# Patient Record
Sex: Female | Born: 1935 | Race: White | Hispanic: No | Marital: Married | Smoking: Former smoker
Health system: Southern US, Community
[De-identification: ages and names within clinical notes are randomized; demographics above are authoritative.]

## PROBLEM LIST (undated history)

## (undated) DIAGNOSIS — I10 Essential (primary) hypertension: Secondary | ICD-10-CM

## (undated) DIAGNOSIS — H9190 Unspecified hearing loss, unspecified ear: Secondary | ICD-10-CM

## (undated) DIAGNOSIS — M1990 Unspecified osteoarthritis, unspecified site: Secondary | ICD-10-CM

## (undated) DIAGNOSIS — J189 Pneumonia, unspecified organism: Secondary | ICD-10-CM

## (undated) DIAGNOSIS — C801 Malignant (primary) neoplasm, unspecified: Secondary | ICD-10-CM

## (undated) DIAGNOSIS — K219 Gastro-esophageal reflux disease without esophagitis: Secondary | ICD-10-CM

## (undated) HISTORY — PX: FRACTURE SURGERY: SHX138

## (undated) HISTORY — PX: EYE SURGERY: SHX253

## (undated) HISTORY — PX: ABDOMINAL HYSTERECTOMY: SHX81

## (undated) HISTORY — PX: SPINAL FUSION: SHX223

## (undated) HISTORY — PX: TONSILLECTOMY: SUR1361

## (undated) HISTORY — PX: JOINT REPLACEMENT: SHX530

---

## 2020-02-14 ENCOUNTER — Other Ambulatory Visit: Payer: Self-pay | Admitting: Orthopaedic Surgery

## 2020-03-14 ENCOUNTER — Encounter (HOSPITAL_COMMUNITY): Payer: Self-pay

## 2020-03-14 NOTE — Progress Notes (Signed)
PCP - Windle Guard Cardiologist -   Chest x-ray -  EKG -  Stress Test -  ECHO -  Cardiac Cath -   Sleep Study -  CPAP -   Fasting Blood Sugar -  Checks Blood Sugar _____ times a day  Blood Thinner Instructions: Aspirin Instructions: Last Dose:  Anesthesia review: pt. Stopping xeljanz 1 week before , abn ekg  Patient denies shortness of breath, fever, cough and chest pain at PAT appointment  none   Patient verbalized understanding of instructions that were given to them at the PAT appointment. Patient was also instructed that they will need to review over the PAT instructions again at home before surgery.

## 2020-03-14 NOTE — Patient Instructions (Addendum)
DUE TO COVID-19 ONLY ONE VISITOR IS ALLOWED TO COME WITH YOU AND STAY IN THE WAITING ROOM ONLY DURING PRE OP AND PROCEDURE DAY OF SURGERY. TWO  VISITORS  MAY VISIT WITH YOU AFTER SURGERY IN YOUR PRIVATE ROOM DURING VISITING HOURS ONLY!   10a-8p  YOU NEED TO HAVE A COVID 19 TEST ON_  4-16-21______ @__10 :30 am _____, THIS TEST MUST BE DONE BEFORE SURGERY, COME  [address], [place] , [id number].  ([place]) ONCE YOUR COVID TEST IS COMPLETED, PLEASE BEGIN THE QUARANTINE INSTRUCTIONS AS OUTLINED IN YOUR HANDOUT.                Tishia Morquecho  03/14/2020   Your procedure is scheduled on: 03-26-20   Report to [facility] Main  Entrance   Report to admitting at     [place] AM     Call this number if you have problems the morning of surgery [phone]    Remember: NO SOLID FOOD AFTER MIDNIGHT THE NIGHT PRIOR TO SURGERY. NOTHING BY MOUTH EXCEPT CLEAR LIQUIDS UNTIL   0645 am  . PLEASE FINISH ENSURE DRINK PER SURGEON ORDER  WHICH NEEDS TO BE COMPLETED AT       0645 am then nothing by mouth .    CLEAR LIQUID DIET   Foods Allowed                                                                                 Foods Excluded  Coffee and tea, regular and decaf   No creamer                                     liquids that you cannot  Plain Jell-O any favor except red or purple                                           see through such as: Fruit ices (not with fruit pulp)                                                         milk, soups, orange juice  Iced Popsicles                                                               All solid food Carbonated beverages, regular and diet                                    Cranberry, grape and apple juices Sports drinks like Gatorade  Lightly seasoned clear broth or consume(fat free) Sugar, honey syrup  _____________________________________________________________________    BRUSH YOUR TEETH MORNING OF SURGERY AND RINSE  YOUR MOUTH OUT, NO CHEWING GUM CANDY OR MINTS.     Take these medicines the morning of surgery with A SIP OF WATER:  omeprazole, hydrocodone                                 You may not have any metal on your body including hair pins and              piercings  Do not wear jewelry, make-up, lotions, powders or perfumes, deodorant             Do not wear nail polish on your fingernails.  Do not shave  48 hours prior to surgery.               Do not bring valuables to the hospital. [place].  Contacts, dentures or bridgework may not be worn into surgery.  Leave suitcase in the car. After surgery it may be brought to your room.               Please read over the following fact sheets you were given: _____________________________________________________________________            [facility] - Preparing for Surgery Before surgery, you can play an important role.  Because skin is not sterile, your skin needs to be as free of germs as possible.  You can reduce the number of germs on your skin by washing with CHG (chlorahexidine gluconate) soap before surgery.  CHG is an antiseptic cleaner which kills germs and bonds with the skin to continue killing germs even after washing. Please DO NOT use if you have an allergy to CHG or antibacterial soaps.  If your skin becomes reddened/irritated stop using the CHG and inform your nurse when you arrive at Short Stay. Do not shave (including legs and underarms) for at least 48 hours prior to the first CHG shower.  You may shave your face/neck. Please follow these instructions carefully:  1.  Shower with CHG Soap the night before surgery and the  morning of Surgery.  2.  If you choose to wash your hair, wash your hair first as usual with your  normal  shampoo.  3.  After you shampoo, rinse your hair and body thoroughly to remove the  shampoo.                           4.  Use CHG as you would any other  liquid soap.  You can apply chg directly  to the skin and wash                       Gently with a scrungie or clean washcloth.  5.  Apply the CHG Soap to your body ONLY FROM THE NECK DOWN.   Do not use on face/ open                           Wound or open sores. Avoid contact with eyes, ears mouth and genitals (private parts).  Wash face,  Genitals (private parts) with your normal soap.             6.  Wash thoroughly, paying special attention to the area where your surgery  will be performed.  7.  Thoroughly rinse your body with warm water from the neck down.  8.  DO NOT shower/wash with your normal soap after using and rinsing off  the CHG Soap.                9.  Pat yourself dry with a clean towel.            10.  Wear clean pajamas.            11.  Place clean sheets on your bed the night of your first shower and do not  sleep with pets. Day of Surgery : Do not apply any lotions/deodorants the morning of surgery.  Please wear clean clothes to the hospital/surgery center.  FAILURE TO FOLLOW THESE INSTRUCTIONS MAY RESULT IN THE CANCELLATION OF YOUR SURGERY PATIENT SIGNATURE_________________________________  NURSE SIGNATURE__________________________________  ________________________________________________________________________   Adam Phenix  An incentive spirometer is a tool that can help keep your lungs clear and active. This tool measures how well you are filling your lungs with each breath. Taking long deep breaths may help reverse or decrease the chance of developing breathing (pulmonary) problems (especially infection) following:  A long period of time when you are unable to move or be active. BEFORE THE PROCEDURE   If the spirometer includes an indicator to show your best effort, your nurse or respiratory therapist will set it to a desired goal.  If possible, sit up straight or lean slightly forward. Try not to slouch.  Hold the incentive  spirometer in an upright position. INSTRUCTIONS FOR USE  1. Sit on the edge of your bed if possible, or sit up as far as you can in bed or on a chair. 2. Hold the incentive spirometer in an upright position. 3. Breathe out normally. 4. Place the mouthpiece in your mouth and seal your lips tightly around it. 5. Breathe in slowly and as deeply as possible, raising the piston or the ball toward the top of the column. 6. Hold your breath for 3-5 seconds or for as long as possible. Allow the piston or ball to fall to the bottom of the column. 7. Remove the mouthpiece from your mouth and breathe out normally. 8. Rest for a few seconds and repeat Steps 1 through 7 at least 10 times every 1-2 hours when you are awake. Take your time and take a few normal breaths between deep breaths. 9. The spirometer may include an indicator to show your best effort. Use the indicator as a goal to work toward during each repetition. 10. After each set of 10 deep breaths, practice coughing to be sure your lungs are clear. If you have an incision (the cut made at the time of surgery), support your incision when coughing by placing a pillow or rolled up towels firmly against it. Once you are able to get out of bed, walk around indoors and cough well. You may stop using the incentive spirometer when instructed by your caregiver.  RISKS AND COMPLICATIONS  Take your time so you do not get dizzy or light-headed.  If you are in pain, you may need to take or ask for pain medication before doing incentive spirometry. It is harder to take a deep breath if you are having pain.  AFTER USE  Rest and breathe slowly and easily.  It can be helpful to keep track of a log of your progress. Your caregiver can provide you with a simple table to help with this. If you are using the spirometer at home, follow these instructions: [place] IF:   You are having difficultly using the spirometer.  You have trouble using the  spirometer as often as instructed.  Your pain medication is not giving enough relief while using the spirometer.  You develop fever of 100.5 F (38.1 C) or higher. SEEK IMMEDIATE MEDICAL CARE IF:   You cough up bloody sputum that had not been present before.  You develop fever of 102 F (38.9 C) or greater.  You develop worsening pain at or near the incision site. MAKE SURE YOU:   Understand these instructions.  Will watch your condition.  Will get help right away if you are not doing well or get worse. Document Released: 04/05/2007 Document Revised: 02/15/2012 Document Reviewed: 06/06/2007 ExitCare Patient Information 2014 ExitCare, [place].   ________________________________________________________________________  WHAT IS A BLOOD TRANSFUSION? Blood Transfusion Information  A transfusion is the replacement of blood or some of its parts. Blood is made up of multiple cells which provide different functions.  Red blood cells carry oxygen and are used for blood loss replacement.  White blood cells fight against infection.  Platelets control bleeding.  Plasma helps clot blood.  Other blood products are available for specialized needs, such as hemophilia or other clotting disorders. BEFORE THE TRANSFUSION  Who gives blood for transfusions?   Healthy volunteers who are fully evaluated to make sure their blood is safe. This is blood bank blood. Transfusion therapy is the safest it has ever been in the practice of medicine. Before blood is taken from a donor, a complete history is taken to make sure that person has no history of diseases nor engages in risky social behavior (examples are intravenous drug use or sexual activity with multiple partners). The donor's travel history is screened to minimize risk of transmitting infections, such as malaria. The donated blood is tested for signs of infectious diseases, such as HIV and hepatitis. The blood is then tested to be sure it is  compatible with you in order to minimize the chance of a transfusion reaction. If you or a relative donates blood, this is often done in anticipation of surgery and is not appropriate for emergency situations. It takes many days to process the donated blood. RISKS AND COMPLICATIONS Although transfusion therapy is very safe and saves many lives, the main dangers of transfusion include:   Getting an infectious disease.  Developing a transfusion reaction. This is an allergic reaction to something in the blood you were given. Every precaution is taken to prevent this. The decision to have a blood transfusion has been considered carefully by your caregiver before blood is given. Blood is not given unless the benefits outweigh the risks. AFTER THE TRANSFUSION  Right after receiving a blood transfusion, you will usually feel much better and more energetic. This is especially true if your red blood cells have gotten low (anemic). The transfusion raises the level of the red blood cells which carry oxygen, and this usually causes an energy increase.  The nurse administering the transfusion will monitor you carefully for complications. HOME CARE INSTRUCTIONS  No special instructions are needed after a transfusion. You may find your energy is better. Speak with your caregiver about any limitations on activity for underlying diseases  you may have. SEEK MEDICAL CARE IF:   Your condition is not improving after your transfusion.  You develop redness or irritation at the intravenous (IV) site. SEEK IMMEDIATE MEDICAL CARE IF:  Any of the following symptoms occur over the next 12 hours:  Shaking chills.  You have a temperature by mouth above 102 F (38.9 C), not controlled by medicine.  Chest, back, or muscle pain.  People around you feel you are not acting correctly or are confused.  Shortness of breath or difficulty breathing.  Dizziness and fainting.  You get a rash or develop hives.  You have  a decrease in urine output.  Your urine turns a dark color or changes to pink, red, or brown. Any of the following symptoms occur over the next 10 days:  You have a temperature by mouth above 102 F (38.9 C), not controlled by medicine.  Shortness of breath.  Weakness after normal activity.  The white part of the eye turns yellow (jaundice).  You have a decrease in the amount of urine or are urinating less often.  Your urine turns a dark color or changes to pink, red, or brown. Document Released: 11/20/2000 Document Revised: 02/15/2012 Document Reviewed: 07/09/2008 [facility] Patient Information 2014 [place], [place].  _______________________________________________________________________

## 2020-03-15 ENCOUNTER — Encounter (HOSPITAL_COMMUNITY)
Admission: RE | Admit: 2020-03-15 | Discharge: 2020-03-15 | Disposition: A | Discharge status: Home / Self Care | Payer: Medicare Other | Source: Ambulatory Visit | Attending: Orthopaedic Surgery | Admitting: Orthopaedic Surgery

## 2020-03-15 ENCOUNTER — Ambulatory Visit (HOSPITAL_COMMUNITY)
Admission: RE | Admit: 2020-03-15 | Discharge: 2020-03-15 | Disposition: A | Discharge status: Home / Self Care | Payer: Medicare Other | Source: Ambulatory Visit | Attending: Orthopaedic Surgery | Admitting: Orthopaedic Surgery

## 2020-03-15 ENCOUNTER — Other Ambulatory Visit: Payer: Self-pay

## 2020-03-15 ENCOUNTER — Encounter (HOSPITAL_COMMUNITY): Payer: Self-pay

## 2020-03-15 DIAGNOSIS — R9431 Abnormal electrocardiogram [ECG] [EKG]: Secondary | ICD-10-CM | POA: Insufficient documentation

## 2020-03-15 DIAGNOSIS — Z01818 Encounter for other preprocedural examination: Secondary | ICD-10-CM | POA: Insufficient documentation

## 2020-03-15 HISTORY — DX: Essential (primary) hypertension: I10

## 2020-03-15 HISTORY — DX: Pneumonia, unspecified organism: J18.9

## 2020-03-15 HISTORY — DX: Unspecified osteoarthritis, unspecified site: M19.90

## 2020-03-15 HISTORY — DX: Malignant (primary) neoplasm, unspecified: C80.1

## 2020-03-15 HISTORY — DX: Gastro-esophageal reflux disease without esophagitis: K21.9

## 2020-03-15 HISTORY — DX: Unspecified hearing loss, unspecified ear: H91.90

## 2020-03-15 LAB — CBC WITH DIFFERENTIAL/PLATELET
Abs Immature Granulocytes: 0.02 10*3/uL (ref 0.00–0.07)
Basophils Absolute: 0 10*3/uL (ref 0.0–0.1)
Basophils Relative: 1 %
Eosinophils Absolute: 0.1 10*3/uL (ref 0.0–0.5)
Eosinophils Relative: 1 %
HCT: 39 % (ref 36.0–46.0)
Hemoglobin: 12.4 g/dL (ref 12.0–15.0)
Immature Granulocytes: 0 %
Lymphocytes Relative: 25 %
Lymphs Abs: 1.2 10*3/uL (ref 0.7–4.0)
MCH: 32.1 pg (ref 26.0–34.0)
MCHC: 31.8 g/dL (ref 30.0–36.0)
MCV: 101 fL — ABNORMAL HIGH (ref 80.0–100.0)
Monocytes Absolute: 0.7 10*3/uL (ref 0.1–1.0)
Monocytes Relative: 15 %
Neutro Abs: 2.9 10*3/uL (ref 1.7–7.7)
Neutrophils Relative %: 58 %
Platelets: 273 10*3/uL (ref 150–400)
RBC: 3.86 MIL/uL — ABNORMAL LOW (ref 3.87–5.11)
RDW: 13.1 % (ref 11.5–15.5)
WBC: 5 10*3/uL (ref 4.0–10.5)
nRBC: 0 % (ref 0.0–0.2)

## 2020-03-15 LAB — URINALYSIS, ROUTINE W REFLEX MICROSCOPIC
Bacteria, UA: NONE SEEN
Bilirubin Urine: NEGATIVE
Glucose, UA: NEGATIVE mg/dL
Hgb urine dipstick: NEGATIVE
Ketones, ur: NEGATIVE mg/dL
Nitrite: NEGATIVE
Protein, ur: NEGATIVE mg/dL
Specific Gravity, Urine: 1.003 — ABNORMAL LOW (ref 1.005–1.030)
pH: 7 (ref 5.0–8.0)

## 2020-03-15 LAB — BASIC METABOLIC PANEL WITH GFR
Anion gap: 8 (ref 5–15)
BUN: 16 mg/dL (ref 8–23)
CO2: 29 mmol/L (ref 22–32)
Calcium: 9.4 mg/dL (ref 8.9–10.3)
Chloride: 101 mmol/L (ref 98–111)
Creatinine, Ser: 0.49 mg/dL (ref 0.44–1.00)
GFR calc Af Amer: >60 mL/min
GFR calc non Af Amer: >60 mL/min
Glucose, Bld: 90 mg/dL (ref 70–99)
Potassium: 3.8 mmol/L (ref 3.5–5.1)
Sodium: 138 mmol/L (ref 135–145)

## 2020-03-15 LAB — SURGICAL PCR SCREEN
MRSA, PCR: POSITIVE — AB
Staphylococcus aureus: POSITIVE — AB

## 2020-03-15 LAB — TYPE AND SCREEN
ABO/RH(D): O POS
Antibody Screen: NEGATIVE

## 2020-03-15 LAB — PROTIME-INR
INR: 1 (ref 0.8–1.2)
Prothrombin Time: 13 s (ref 11.4–15.2)

## 2020-03-15 LAB — APTT: aPTT: 28 s (ref 24–36)

## 2020-03-15 LAB — ABO/RH: ABO/RH(D): O POS

## 2020-03-22 ENCOUNTER — Other Ambulatory Visit (HOSPITAL_COMMUNITY)
Admission: RE | Admit: 2020-03-22 | Discharge: 2020-03-22 | Disposition: A | Discharge status: Home / Self Care | Payer: Medicare Other | Source: Ambulatory Visit | Attending: Orthopaedic Surgery | Admitting: Orthopaedic Surgery

## 2020-03-22 DIAGNOSIS — Z01812 Encounter for preprocedural laboratory examination: Secondary | ICD-10-CM | POA: Insufficient documentation

## 2020-03-22 DIAGNOSIS — Z20822 Contact with and (suspected) exposure to covid-19: Secondary | ICD-10-CM | POA: Insufficient documentation

## 2020-03-22 LAB — SARS CORONAVIRUS 2 (TAT 6-24 HRS): SARS Coronavirus 2: NEGATIVE

## 2020-03-22 NOTE — H&P (Signed)
TOTAL HIP ADMISSION H&P  Patient is admitted for left total hip arthroplasty.  Subjective:  Chief Complaint: left hip pain  HPI: Theresa Byrd, 84 y.o. female, has a history of pain and functional disability in the left hip(s) due to arthritis and patient has failed non-surgical conservative treatments for greater than 12 weeks to include NSAID's and/or analgesics, corticosteriod injections, flexibility and strengthening excercises, use of assistive devices, weight reduction as appropriate and activity modification.  Onset of symptoms was gradual starting 5 years ago with gradually worsening course since that time.The patient noted no past surgery on the left hip(s).  Patient currently rates pain in the left hip at 10 out of 10 with activity. Patient has night pain, worsening of pain with activity and weight bearing, trendelenberg gait, pain that interfers with activities of daily living and crepitus. Patient has evidence of subchondral cysts, subchondral sclerosis, periarticular osteophytes and joint space narrowing by imaging studies. This condition presents safety issues increasing the risk of falls. There is no current active infection.  There are no problems to display for this patient.  Past Medical History:  Diagnosis Date  . Arthritis    Rheumatoid  and psoriatic  . Cancer ([place])    skin cancer  . GERD (gastroesophageal reflux disease)   . Hypertension   . Severe hearing loss   . Walking pneumonia     Past Surgical History:  Procedure Laterality Date  . ABDOMINAL HYSTERECTOMY     partial  . EYE SURGERY     cataract bil  . FRACTURE SURGERY     right ankle  . JOINT REPLACEMENT     Right hip replacement  . SPINAL FUSION     2001   . TONSILLECTOMY     and adneoids    No current facility-administered medications for this encounter.   Current Outpatient Medications  Medication Sig Dispense Refill Last Dose  . ascorbic acid ([employer] VITAMIN C) 500 MG tablet Take 500 mg by  mouth daily.     . Calcium Carb-Cholecalciferol (CALCIUM 600+D3 PO) Take 1 tablet by mouth daily.     . cholecalciferol (VITAMIN D) 25 MCG (1000 UNIT) tablet Take 1,000 Units by mouth daily.     . hydrochlorothiazide (HYDRODIURIL) 12.5 MG tablet Take 12.5 mg by mouth daily.     Marland Kitchen HYDROcodone-acetaminophen (NORCO/VICODIN) 5-325 MG tablet Take 1 tablet by mouth in the morning and at bedtime.     . Multiple Vitamin (MULTIVITAMIN WITH MINERALS) TABS tablet Take 1 tablet by mouth daily. Centrum Silver     . Multiple Vitamins-Minerals (HAIR/SKIN/NAILS/BIOTIN PO) Take 1 tablet by mouth daily.     Marland Kitchen omeprazole (PRILOSEC) 20 MG capsule Take 20 mg by mouth daily before breakfast.     . traMADol (ULTRAM) 50 MG tablet Take 50 mg by mouth daily as needed (breakthrough hip pain.).     Marland Kitchen XELJANZ 5 MG TABS Take 5 mg by mouth in the morning and at bedtime. Per pt. Dr. Stop I week prior to surgery and hold 3 weeks after surgery recommended      No Known Allergies  Social History   Tobacco Use  . Smoking status: Former Smoker    Years: 18.00  . Smokeless tobacco: Never Used  . Tobacco comment: quit 32 years ago never heavy  Substance Use Topics  . Alcohol use: Not Currently    Alcohol/week: 1.0 standard drinks    Types: 1 Glasses of wine per week    Comment: occasional  No family history on file.   Review of Systems  Musculoskeletal: Positive for arthralgias.       Left hip  All other systems reviewed and are negative.   Objective:  Physical Exam  Constitutional: She appears well-developed and well-nourished.  HENT:  Head: Normocephalic and atraumatic.  Eyes: Pupils are equal, round, and reactive to light.  Cardiovascular: Normal rate and regular rhythm.  Respiratory: Effort normal.  GI: Soft.  Musculoskeletal:     Cervical back: Normal range of motion.     Comments: Left hip is fairly painful in internal rotation today.  Her motion remains good.  I do not get any pain over the greater  trochanter.  Straight leg raise is negative.  Sensation and motor function are intact in her feet with palpable pulses on both sides.      Vital signs in last 24 hours:    Labs:   Estimated body mass index is 25.5 kg/m as calculated from the following:   Height as of 03/15/20: 5\' 6"  (1.676 m).   Weight as of 03/15/20: 71.7 kg.   Imaging Review Plain radiographs demonstrate severe degenerative joint disease of the left hip(s). The bone quality appears to be good for age and reported activity level.      Assessment/Plan:  End stage primary arthritis, left hip(s)  The patient history, physical examination, clinical judgement of the provider and imaging studies are consistent with end stage degenerative joint disease of the left hip(s) and total hip arthroplasty is deemed medically necessary. The treatment options including medical management, injection therapy, arthroscopy and arthroplasty were discussed at length. The risks and benefits of total hip arthroplasty were presented and reviewed. The risks due to aseptic loosening, infection, stiffness, dislocation/subluxation,  thromboembolic complications and other imponderables were discussed.  The patient acknowledged the explanation, agreed to proceed with the plan and consent was signed. Patient is being admitted for inpatient treatment for surgery, pain control, PT, OT, prophylactic antibiotics, VTE prophylaxis, progressive ambulation and ADL's and discharge planning.The patient is planning to be discharged home with home health services

## 2020-03-25 MED ORDER — BUPIVACAINE LIPOSOME 1.3 % IJ SUSP
10.0000 mL | Freq: Once | INTRAMUSCULAR | Status: DC
  Filled 2020-03-25: qty 10

## 2020-03-25 MED ORDER — TRANEXAMIC ACID 1000 MG/10ML IV SOLN
2000.0000 mg | INTRAVENOUS | Status: DC
  Filled 2020-03-25: qty 20

## 2020-03-26 ENCOUNTER — Observation Stay (HOSPITAL_COMMUNITY)
Admission: RE | Admit: 2020-03-26 | Discharge: 2020-03-27 | Disposition: A | Discharge status: Rehabilitation Facility | Payer: Medicare Other | Source: Other Acute Inpatient Hospital | Attending: Orthopaedic Surgery | Admitting: Orthopaedic Surgery

## 2020-03-26 ENCOUNTER — Observation Stay (HOSPITAL_COMMUNITY): Payer: Medicare Other

## 2020-03-26 ENCOUNTER — Encounter (HOSPITAL_COMMUNITY): Payer: Self-pay | Admitting: Orthopaedic Surgery

## 2020-03-26 ENCOUNTER — Encounter (HOSPITAL_COMMUNITY)
Admission: RE | Disposition: A | Discharge status: Rehabilitation Facility | Payer: Self-pay | Source: Other Acute Inpatient Hospital | Attending: Orthopaedic Surgery

## 2020-03-26 ENCOUNTER — Ambulatory Visit (HOSPITAL_COMMUNITY): Payer: Medicare Other

## 2020-03-26 ENCOUNTER — Ambulatory Visit (HOSPITAL_COMMUNITY): Payer: Medicare Other | Admitting: Physician Assistant

## 2020-03-26 ENCOUNTER — Other Ambulatory Visit: Payer: Self-pay

## 2020-03-26 ENCOUNTER — Ambulatory Visit (HOSPITAL_COMMUNITY): Payer: Medicare Other | Admitting: Certified Registered"

## 2020-03-26 DIAGNOSIS — L4050 Arthropathic psoriasis, unspecified: Secondary | ICD-10-CM | POA: Insufficient documentation

## 2020-03-26 DIAGNOSIS — Z87891 Personal history of nicotine dependence: Secondary | ICD-10-CM | POA: Insufficient documentation

## 2020-03-26 DIAGNOSIS — Z09 Encounter for follow-up examination after completed treatment for conditions other than malignant neoplasm: Secondary | ICD-10-CM

## 2020-03-26 DIAGNOSIS — I10 Essential (primary) hypertension: Secondary | ICD-10-CM | POA: Insufficient documentation

## 2020-03-26 DIAGNOSIS — M1612 Unilateral primary osteoarthritis, left hip: Principal | ICD-10-CM | POA: Diagnosis present

## 2020-03-26 DIAGNOSIS — M069 Rheumatoid arthritis, unspecified: Secondary | ICD-10-CM | POA: Insufficient documentation

## 2020-03-26 DIAGNOSIS — Z419 Encounter for procedure for purposes other than remedying health state, unspecified: Secondary | ICD-10-CM

## 2020-03-26 DIAGNOSIS — Z7982 Long term (current) use of aspirin: Secondary | ICD-10-CM | POA: Diagnosis not present

## 2020-03-26 DIAGNOSIS — Z85828 Personal history of other malignant neoplasm of skin: Secondary | ICD-10-CM | POA: Insufficient documentation

## 2020-03-26 DIAGNOSIS — K219 Gastro-esophageal reflux disease without esophagitis: Secondary | ICD-10-CM | POA: Insufficient documentation

## 2020-03-26 DIAGNOSIS — Z96641 Presence of right artificial hip joint: Secondary | ICD-10-CM | POA: Diagnosis not present

## 2020-03-26 DIAGNOSIS — Z981 Arthrodesis status: Secondary | ICD-10-CM | POA: Diagnosis not present

## 2020-03-26 DIAGNOSIS — Z90710 Acquired absence of both cervix and uterus: Secondary | ICD-10-CM | POA: Diagnosis not present

## 2020-03-26 DIAGNOSIS — Z79899 Other long term (current) drug therapy: Secondary | ICD-10-CM | POA: Insufficient documentation

## 2020-03-26 DIAGNOSIS — M5136 Other intervertebral disc degeneration, lumbar region: Secondary | ICD-10-CM | POA: Diagnosis not present

## 2020-03-26 HISTORY — PX: TOTAL HIP ARTHROPLASTY: SHX124

## 2020-03-26 SURGERY — ARTHROPLASTY, HIP, TOTAL, ANTERIOR APPROACH
Anesthesia: Spinal | Site: Hip | Laterality: Left

## 2020-03-26 MED ORDER — VANCOMYCIN HCL IN DEXTROSE 1-5 GM/200ML-% IV SOLN
1000.0000 mg | INTRAVENOUS | Status: AC
  Administered 2020-03-26: 1000 mg via INTRAVENOUS
  Filled 2020-03-26 (×2): qty 200

## 2020-03-26 MED ORDER — METHOCARBAMOL 500 MG IVPB - SIMPLE MED
500.0000 mg | Freq: Four times a day (QID) | INTRAVENOUS | Status: DC | PRN
  Administered 2020-03-26: 500 mg via INTRAVENOUS
  Filled 2020-03-26: qty 50

## 2020-03-26 MED ORDER — ALUM & MAG HYDROXIDE-SIMETH 200-200-20 MG/5ML PO SUSP: 30.0000 mL | ORAL | Status: DC | PRN

## 2020-03-26 MED ORDER — TRANEXAMIC ACID-NACL 1000-0.7 MG/100ML-% IV SOLN
1000.0000 mg | Freq: Once | INTRAVENOUS | Status: AC
  Administered 2020-03-26: 1000 mg via INTRAVENOUS
  Filled 2020-03-26: qty 100

## 2020-03-26 MED ORDER — PROPOFOL 10 MG/ML IV BOLUS
INTRAVENOUS | Status: AC
  Filled 2020-03-26: qty 20

## 2020-03-26 MED ORDER — POVIDONE-IODINE 10 % EX SWAB
2.0000 "application " | Freq: Once | CUTANEOUS | Status: AC
  Administered 2020-03-26: 2 via TOPICAL

## 2020-03-26 MED ORDER — HYDROCODONE-ACETAMINOPHEN 5-325 MG PO TABS
1.0000 | ORAL_TABLET | ORAL | Status: DC | PRN
  Administered 2020-03-26: 1 via ORAL
  Administered 2020-03-27 (×2): 2 via ORAL
  Filled 2020-03-26: qty 1
  Filled 2020-03-26 (×2): qty 2

## 2020-03-26 MED ORDER — BISACODYL 5 MG PO TBEC: 5.0000 mg | DELAYED_RELEASE_TABLET | Freq: Every day | ORAL | Status: DC | PRN

## 2020-03-26 MED ORDER — TRANEXAMIC ACID 1000 MG/10ML IV SOLN
INTRAVENOUS | Status: DC | PRN
  Administered 2020-03-26: 2000 mg via TOPICAL

## 2020-03-26 MED ORDER — CEFAZOLIN SODIUM-DEXTROSE 2-4 GM/100ML-% IV SOLN
2.0000 g | INTRAVENOUS | Status: AC
  Administered 2020-03-26: 2 g via INTRAVENOUS
  Filled 2020-03-26: qty 100

## 2020-03-26 MED ORDER — DIPHENHYDRAMINE HCL 12.5 MG/5ML PO ELIX: 12.5000 mg | ORAL_SOLUTION | ORAL | Status: DC | PRN

## 2020-03-26 MED ORDER — HYDROCHLOROTHIAZIDE 25 MG PO TABS
12.5000 mg | ORAL_TABLET | Freq: Every day | ORAL | Status: DC
  Administered 2020-03-27: 12.5 mg via ORAL
  Filled 2020-03-26: qty 1

## 2020-03-26 MED ORDER — MENTHOL 3 MG MT LOZG: 1.0000 | LOZENGE | OROMUCOSAL | Status: DC | PRN

## 2020-03-26 MED ORDER — PROPOFOL 500 MG/50ML IV EMUL
INTRAVENOUS | Status: DC | PRN
  Administered 2020-03-26: 60 ug/kg/min via INTRAVENOUS
  Administered 2020-03-26: 50 ug/kg/min via INTRAVENOUS

## 2020-03-26 MED ORDER — ACETAMINOPHEN 500 MG PO TABS
500.0000 mg | ORAL_TABLET | Freq: Four times a day (QID) | ORAL | Status: AC
  Administered 2020-03-26 – 2020-03-27 (×4): 500 mg via ORAL
  Filled 2020-03-26 (×4): qty 1

## 2020-03-26 MED ORDER — BUPIVACAINE IN DEXTROSE 0.75-8.25 % IT SOLN
INTRATHECAL | Status: DC | PRN
  Administered 2020-03-26: 1.8 mL via INTRATHECAL

## 2020-03-26 MED ORDER — DEXAMETHASONE SODIUM PHOSPHATE 10 MG/ML IJ SOLN
INTRAMUSCULAR | Status: AC
  Filled 2020-03-26: qty 1

## 2020-03-26 MED ORDER — METHOCARBAMOL 500 MG IVPB - SIMPLE MED
INTRAVENOUS | Status: AC
  Filled 2020-03-26: qty 50

## 2020-03-26 MED ORDER — MIDAZOLAM HCL 2 MG/2ML IJ SOLN
INTRAMUSCULAR | Status: DC | PRN
  Administered 2020-03-26: 1 mg via INTRAVENOUS

## 2020-03-26 MED ORDER — FENTANYL CITRATE (PF) 100 MCG/2ML IJ SOLN
INTRAMUSCULAR | Status: AC
  Filled 2020-03-26: qty 2

## 2020-03-26 MED ORDER — FENTANYL CITRATE (PF) 100 MCG/2ML IJ SOLN
INTRAMUSCULAR | Status: DC | PRN
  Administered 2020-03-26 (×2): 25 ug via INTRAVENOUS
  Administered 2020-03-26 (×2): 12.5 ug via INTRAVENOUS
  Administered 2020-03-26 (×2): 50 ug via INTRAVENOUS
  Administered 2020-03-26 (×2): 12.5 ug via INTRAVENOUS

## 2020-03-26 MED ORDER — HYDROCODONE-ACETAMINOPHEN 7.5-325 MG PO TABS: 1.0000 | ORAL_TABLET | ORAL | Status: DC | PRN

## 2020-03-26 MED ORDER — TRANEXAMIC ACID-NACL 1000-0.7 MG/100ML-% IV SOLN
1000.0000 mg | INTRAVENOUS | Status: AC
  Administered 2020-03-26: 1000 mg via INTRAVENOUS
  Filled 2020-03-26: qty 100

## 2020-03-26 MED ORDER — LIDOCAINE 2% (20 MG/ML) 5 ML SYRINGE
INTRAMUSCULAR | Status: DC | PRN
  Administered 2020-03-26: 40 mg via INTRAVENOUS

## 2020-03-26 MED ORDER — LACTATED RINGERS IV SOLN: INTRAVENOUS | Status: DC

## 2020-03-26 MED ORDER — BUPIVACAINE-EPINEPHRINE (PF) 0.5% -1:200000 IJ SOLN
INTRAMUSCULAR | Status: AC
  Filled 2020-03-26: qty 30

## 2020-03-26 MED ORDER — FENTANYL CITRATE (PF) 100 MCG/2ML IJ SOLN
25.0000 ug | INTRAMUSCULAR | Status: DC | PRN
  Administered 2020-03-26 (×2): 50 ug via INTRAVENOUS

## 2020-03-26 MED ORDER — PROPOFOL 10 MG/ML IV BOLUS
INTRAVENOUS | Status: DC | PRN
  Administered 2020-03-26: 20 mg via INTRAVENOUS
  Administered 2020-03-26: 30 mg via INTRAVENOUS
  Administered 2020-03-26: 10 mg via INTRAVENOUS

## 2020-03-26 MED ORDER — ASPIRIN 81 MG PO CHEW
81.0000 mg | CHEWABLE_TABLET | Freq: Two times a day (BID) | ORAL | Status: DC
  Filled 2020-03-26: qty 1

## 2020-03-26 MED ORDER — METOCLOPRAMIDE HCL 5 MG/ML IJ SOLN: 5.0000 mg | Freq: Three times a day (TID) | INTRAMUSCULAR | Status: DC | PRN

## 2020-03-26 MED ORDER — KETOROLAC TROMETHAMINE 15 MG/ML IJ SOLN
7.5000 mg | Freq: Four times a day (QID) | INTRAMUSCULAR | Status: AC
  Administered 2020-03-26 – 2020-03-27 (×4): 7.5 mg via INTRAVENOUS
  Filled 2020-03-26 (×4): qty 1

## 2020-03-26 MED ORDER — BUPIVACAINE-EPINEPHRINE (PF) 0.5% -1:200000 IJ SOLN
INTRAMUSCULAR | Status: DC | PRN
  Administered 2020-03-26: 30 mL via PERINEURAL

## 2020-03-26 MED ORDER — VANCOMYCIN HCL IN DEXTROSE 1-5 GM/200ML-% IV SOLN: 1000.0000 mg | Freq: Two times a day (BID) | INTRAVENOUS | Status: DC

## 2020-03-26 MED ORDER — STERILE WATER FOR IRRIGATION IR SOLN
Status: DC | PRN
  Administered 2020-03-26: 2000 mL

## 2020-03-26 MED ORDER — PHENYLEPHRINE HCL-NACL 10-0.9 MG/250ML-% IV SOLN
INTRAVENOUS | Status: DC | PRN
  Administered 2020-03-26: 20 ug/min via INTRAVENOUS

## 2020-03-26 MED ORDER — MIDAZOLAM HCL 2 MG/2ML IJ SOLN
INTRAMUSCULAR | Status: AC
  Filled 2020-03-26: qty 2

## 2020-03-26 MED ORDER — DOCUSATE SODIUM 100 MG PO CAPS
100.0000 mg | ORAL_CAPSULE | Freq: Two times a day (BID) | ORAL | Status: DC
  Administered 2020-03-26 – 2020-03-27 (×2): 100 mg via ORAL
  Filled 2020-03-26 (×2): qty 1

## 2020-03-26 MED ORDER — LIDOCAINE 2% (20 MG/ML) 5 ML SYRINGE
INTRAMUSCULAR | Status: AC
  Filled 2020-03-26: qty 5

## 2020-03-26 MED ORDER — DEXAMETHASONE SODIUM PHOSPHATE 10 MG/ML IJ SOLN
INTRAMUSCULAR | Status: DC | PRN
  Administered 2020-03-26: 4 mg via INTRAVENOUS

## 2020-03-26 MED ORDER — MORPHINE SULFATE (PF) 2 MG/ML IV SOLN: 0.5000 mg | INTRAVENOUS | Status: DC | PRN

## 2020-03-26 MED ORDER — METOCLOPRAMIDE HCL 5 MG PO TABS: 5.0000 mg | ORAL_TABLET | Freq: Three times a day (TID) | ORAL | Status: DC | PRN

## 2020-03-26 MED ORDER — ACETAMINOPHEN 500 MG PO TABS
1000.0000 mg | ORAL_TABLET | Freq: Once | ORAL | Status: AC
  Administered 2020-03-26: 1000 mg via ORAL
  Filled 2020-03-26: qty 2

## 2020-03-26 MED ORDER — BUPIVACAINE-EPINEPHRINE (PF) 0.25% -1:200000 IJ SOLN: INTRAMUSCULAR | Status: DC | PRN

## 2020-03-26 MED ORDER — CHLORHEXIDINE GLUCONATE 4 % EX LIQD: 60.0000 mL | Freq: Once | CUTANEOUS | Status: DC

## 2020-03-26 MED ORDER — BUPIVACAINE LIPOSOME 1.3 % IJ SUSP
INTRAMUSCULAR | Status: DC | PRN
  Administered 2020-03-26: 10 mL

## 2020-03-26 MED ORDER — ONDANSETRON HCL 4 MG/2ML IJ SOLN: 4.0000 mg | Freq: Four times a day (QID) | INTRAMUSCULAR | Status: DC | PRN

## 2020-03-26 MED ORDER — METHOCARBAMOL 500 MG PO TABS: 500.0000 mg | ORAL_TABLET | Freq: Four times a day (QID) | ORAL | Status: DC | PRN

## 2020-03-26 MED ORDER — 0.9 % SODIUM CHLORIDE (POUR BTL) OPTIME
TOPICAL | Status: DC | PRN
  Administered 2020-03-26: 1000 mL

## 2020-03-26 MED ORDER — PHENOL 1.4 % MT LIQD: 1.0000 | OROMUCOSAL | Status: DC | PRN

## 2020-03-26 MED ORDER — ONDANSETRON HCL 4 MG PO TABS: 4.0000 mg | ORAL_TABLET | Freq: Four times a day (QID) | ORAL | Status: DC | PRN

## 2020-03-26 MED ORDER — PHENYLEPHRINE HCL (PRESSORS) 10 MG/ML IV SOLN
INTRAVENOUS | Status: AC
  Filled 2020-03-26: qty 1

## 2020-03-26 MED ORDER — EPHEDRINE SULFATE-NACL 50-0.9 MG/10ML-% IV SOSY
PREFILLED_SYRINGE | INTRAVENOUS | Status: DC | PRN
  Administered 2020-03-26: 5 mg via INTRAVENOUS

## 2020-03-26 MED ORDER — PROPOFOL 1000 MG/100ML IV EMUL
INTRAVENOUS | Status: AC
  Filled 2020-03-26: qty 100

## 2020-03-26 MED ORDER — ACETAMINOPHEN 325 MG PO TABS: 325.0000 mg | ORAL_TABLET | Freq: Four times a day (QID) | ORAL | Status: DC | PRN

## 2020-03-26 MED ORDER — ONDANSETRON HCL 4 MG/2ML IJ SOLN
INTRAMUSCULAR | Status: DC | PRN
  Administered 2020-03-26: 4 mg via INTRAVENOUS

## 2020-03-26 MED ORDER — PANTOPRAZOLE SODIUM 40 MG PO TBEC
40.0000 mg | DELAYED_RELEASE_TABLET | Freq: Every day | ORAL | Status: DC
  Administered 2020-03-27: 40 mg via ORAL
  Filled 2020-03-26: qty 1

## 2020-03-26 MED ORDER — ONDANSETRON HCL 4 MG/2ML IJ SOLN
INTRAMUSCULAR | Status: AC
  Filled 2020-03-26: qty 2

## 2020-03-26 SURGICAL SUPPLY — 43 items
BAG DECANTER FOR FLEXI CONT (MISCELLANEOUS) ×3 IMPLANT
BLADE SAW SGTL 18X1.27X75 (BLADE) ×2 IMPLANT
BLADE SAW SGTL 18X1.27X75MM (BLADE) ×1
BOOTIES KNEE HIGH SLOAN (MISCELLANEOUS) ×3 IMPLANT
CELLS DAT CNTRL 66122 CELL SVR (MISCELLANEOUS) ×1 IMPLANT
COVER PERINEAL POST (MISCELLANEOUS) ×3 IMPLANT
COVER SURGICAL LIGHT HANDLE (MISCELLANEOUS) ×3 IMPLANT
COVER WAND RF STERILE (DRAPES) IMPLANT
CUP GRIPTON 48MM 100 HIP (Hips) ×3 IMPLANT
DECANTER SPIKE VIAL GLASS SM (MISCELLANEOUS) ×3 IMPLANT
DRAPE IMP U-DRAPE 54X76 (DRAPES) ×3 IMPLANT
DRAPE STERI IOBAN 125X83 (DRAPES) ×3 IMPLANT
DRAPE U-SHAPE 47X51 STRL (DRAPES) ×6 IMPLANT
DRSG AQUACEL AG ADV 3.5X 6 (GAUZE/BANDAGES/DRESSINGS) ×3 IMPLANT
DURAPREP 26ML APPLICATOR (WOUND CARE) ×3 IMPLANT
ELECT BLADE TIP CTD 4 INCH (ELECTRODE) ×3 IMPLANT
ELECT REM PT RETURN 15FT ADLT (MISCELLANEOUS) ×3 IMPLANT
ELIMINATOR HOLE APEX DEPUY (Hips) ×3 IMPLANT
GLOVE BIO SURGEON STRL SZ8 (GLOVE) ×6 IMPLANT
GLOVE BIOGEL PI IND STRL 8 (GLOVE) ×2 IMPLANT
GLOVE BIOGEL PI INDICATOR 8 (GLOVE) ×4
GOWN STRL REUS W/TWL XL LVL3 (GOWN DISPOSABLE) ×6 IMPLANT
HEAD FEM STD 32X+9 STRL (Hips) ×3 IMPLANT
HOLDER FOLEY CATH W/STRAP (MISCELLANEOUS) ×3 IMPLANT
KIT TURNOVER KIT A (KITS) IMPLANT
LINER ACET 32X48 (Liner) ×3 IMPLANT
MANIFOLD NEPTUNE II (INSTRUMENTS) ×3 IMPLANT
NEEDLE HYPO 22GX1.5 SAFETY (NEEDLE) ×3 IMPLANT
NS IRRIG 1000ML POUR BTL (IV SOLUTION) ×3 IMPLANT
PACK ANTERIOR HIP CUSTOM (KITS) ×3 IMPLANT
PENCIL SMOKE EVACUATOR (MISCELLANEOUS) IMPLANT
PROTECTOR NERVE ULNAR (MISCELLANEOUS) ×3 IMPLANT
RTRCTR WOUND ALEXIS 18CM MED (MISCELLANEOUS) ×3
STEM FEMORAL SZ5 HIGH ACTIS (Stem) ×3 IMPLANT
SUT ETHIBOND NAB CT1 #1 30IN (SUTURE) ×6 IMPLANT
SUT VIC AB 1 CT1 36 (SUTURE) ×3 IMPLANT
SUT VIC AB 2-0 CT1 27 (SUTURE) ×2
SUT VIC AB 2-0 CT1 TAPERPNT 27 (SUTURE) ×1 IMPLANT
SUT VICRYL AB 3-0 FS1 BRD 27IN (SUTURE) ×3 IMPLANT
SUT VLOC 180 0 24IN GS25 (SUTURE) ×3 IMPLANT
SYR 50ML LL SCALE MARK (SYRINGE) ×3 IMPLANT
TRAY FOLEY MTR SLVR 16FR STAT (SET/KITS/TRAYS/PACK) ×3 IMPLANT
YANKAUER SUCT BULB TIP 10FT TU (MISCELLANEOUS) ×3 IMPLANT

## 2020-03-26 NOTE — Anesthesia Procedure Notes (Signed)
Spinal  Patient location during procedure: OR Start time: 03/26/2020 12:50 PM End time: 03/26/2020 1:00 PM Staffing Performed: anesthesiologist  Anesthesiologist: Freddrick March, MD Preanesthetic Checklist Completed: patient identified, IV checked, risks and benefits discussed, surgical consent, monitors and equipment checked, pre-op evaluation and timeout performed Spinal Block Patient position: sitting Prep: DuraPrep and site prepped and draped Patient monitoring: cardiac monitor, continuous pulse ox and blood pressure Approach: right paramedian Location: L3-4 Injection technique: single-shot Needle Needle type: Pencan  Needle gauge: 24 G Needle length: 9 cm Assessment Sensory level: T6 Additional Notes Functioning IV was confirmed and monitors were applied. Sterile prep and drape, including hand hygiene and sterile gloves were used. The patient was positioned and the spine was prepped. The skin was anesthetized with lidocaine.  Free flow of clear CSF was obtained prior to injecting local anesthetic into the CSF.  The spinal needle aspirated freely following injection.  The needle was carefully withdrawn.  The patient tolerated the procedure well.

## 2020-03-26 NOTE — Op Note (Signed)
PRE-OP DIAGNOSIS:  LEFT HIP DEGENERATIVE JOINT DISEASE POST-OP DIAGNOSIS: same PROCEDURE:  LEFT TOTAL HIP ARTHROPLASTY ANTERIOR APPROACH ANESTHESIA:  Spinal and MAC SURGEON:  Melrose Nakayama MD ASSISTANT:  Loni Dolly PA-C   INDICATIONS FOR PROCEDURE:  The patient is a 84 y.o. female with a long history of a painful hip.  This has persisted despite multiple conservative measures.  The patient has persisted with pain and dysfunction making rest and activity difficult.  A total hip replacement is offered as surgical treatment.  Informed operative consent was obtained after discussion of possible complications including reaction to anesthesia, infection, neurovascular injury, dislocation, DVT, PE, and death.  The importance of the postoperative rehab program to optimize result was stressed with the patient.  SUMMARY OF FINDINGS AND PROCEDURE:  Under the above anesthesia through a anterior approach an the Hana table a left THR was performed.  The patient had severe degenerative change and fair bone quality.  We used DePuy components to replace the hip and these were size 5 high offset Actis femur capped with a +9 38m metal hip ball.  On the acetabular side we used a size 48 Gription shell with a plus 0 neutral polyethylene liner.  We did use a hole eliminator.  ALoni DollyPA-C assisted throughout and was invaluable to the completion of the case in that he helped position and retract while I performed the procedure.  He also closed simultaneously to help minimize OR time.  I used fluoroscopy throughout the case to check position of components and leg lengths and read all these views myself.  DESCRIPTION OF PROCEDURE:  The patient was taken to the OR suite where the above anesthetic was applied.  The patient was then positioned on the Hana table supine.  All bony prominences were appropriately padded.  Prep and drape was then performed in normal sterile fashion.  The patient was given kefzol and vancomycin  preoperative antibiotic and an appropriate time out was performed.  We then took an anterior approach to the left hip.  Dissection was taken through adipose to the tensor fascia lata fascia.  This structure was incised longitudinally and we dissected in the intermuscular interval just medial to this muscle.  Cobra retractors were placed superior and inferior to the femoral neck superficial to the capsule.  A capsular incision was then made and the retractors were placed along the femoral neck.  Xray was brought in to get a good level for the femoral neck cut which was made with an oscillating saw and osteotome.  The femoral head was removed with a corkscrew.  The acetabulum was exposed and some labral tissues were excised. Reaming was taken to the inside wall of the pelvis and sequentially up to 1 mm smaller than the actual component.  A trial of components was done and then the aforementioned acetabular shell was placed in appropriate tilt and anteversion confirmed by fluoroscopy. The liner was placed along with the hole eliminator and attention was turned to the femur.  The leg was brought down and over into adduction and the elevator bar was used to raise the femur up gently in the wound.  The piriformis was released with care taken to preserve the obturator internus attachment and all of the posterior capsule. The femur was reamed and then broached to the appropriate size.  A trial reduction was done and the aforementioned head and neck assembly gave uKoreathe best stability in extension with external rotation.  Leg lengths were felt to be  about equal by fluoroscopic exam.  The trial components were removed and the wound irrigated.  We then placed the femoral component in appropriate anteversion.  The head was applied to a dry stem neck and the hip again reduced.  It was again stable in the aforementioned position.  The would was irrigated again followed by re-approximation of anterior capsule with ethibond suture.  Tensor fascia was repaired with V-loc suture  followed by deep closure with #O and #2 undyed vicryl.  Skin was closed with subQ stitch and steristrips followed by a sterile dressing.  EBL and IOF can be obtained from anesthesia records.  DISPOSITION:  The patient was extubated in the OR and taken to PACU in stable condition to be admitted to the Orthopedic Surgery for appropriate post-op care to include perioperative antibiotics and DVT prophylaxis.

## 2020-03-26 NOTE — Transfer of Care (Signed)
Immediate Anesthesia Transfer of Care Note  Patient: Theresa Byrd  Procedure(s) Performed: LEFT TOTAL HIP ARTHROPLASTY ANTERIOR APPROACH (Left Hip)  Patient Location: PACU  Anesthesia Type:Spinal  Level of Consciousness: awake, alert  and oriented  Airway & Oxygen Therapy: Patient Spontanous Breathing and Patient connected to face mask oxygen  Post-op Assessment: Report given to RN and Post -op Vital signs reviewed and stable  Post vital signs: Reviewed and stable  Last Vitals:  Vitals Value Taken Time  BP 130/103 03/26/20 1454  Temp 36.3 C 03/26/20 1454  Pulse 68 03/26/20 1459  Resp 16 03/26/20 1459  SpO2 100 % 03/26/20 1459  Vitals shown include unvalidated device data.  Last Pain:  Vitals:   03/26/20 1454  TempSrc:   PainSc: 6       Patients Stated Pain Goal: 2 ([id number])  Complications: No apparent anesthesia complications

## 2020-03-26 NOTE — Plan of Care (Signed)
Plan of care 

## 2020-03-26 NOTE — Interval H&P Note (Signed)
History and Physical Interval Note:  03/26/2020 11:42 AM  Theresa Byrd  has presented today for surgery, with the diagnosis of LEFT HIP DEGENERATIVE JOINT DISEASE.  The various methods of treatment have been discussed with the patient and family. After consideration of risks, benefits and other options for treatment, the patient has consented to  Procedure(s): LEFT TOTAL HIP ARTHROPLASTY ANTERIOR APPROACH (Left) as a surgical intervention.  The patient's history has been reviewed, patient examined, no change in status, stable for surgery.  I have reviewed the patient's chart and labs.  Questions were answered to the patient's satisfaction.     [name]

## 2020-03-26 NOTE — Evaluation (Signed)
Physical Therapy Evaluation Patient Details Name: Theresa Byrd MRN: [id number] DOB: 15-Sep-1936 Today's Date: 03/26/2020   History of Present Illness  Patient is 84 y.o. female s/p Lt THA anterior approach on 03/26/20 with PMH significant for PNA, HOH, HTN, GERD, OA, Rt tHA, spinal fusion in 2001.    Clinical Impression  Theresa Byrd is a 84 y.o. female POD 0 s/p Lt THA anterior approach. Patient reports modified independence with use of RW for mobility at since February due to pain. Patient is now limited by functional impairments (see PT problem list below) and requires min assist for transfers and gait with RW. Patient was able to ambulate ~4 feet with RW and min assist and was limited by weak feeling and lightheadedness. Patient instructed in exercise to facilitate circulation. Patient will benefit from continued skilled PT interventions to address impairments and progress towards PLOF. Acute PT will follow to progress mobility training in preparation for safe discharge home.     Follow Up Recommendations Follow surgeon's recommendation for DC plan and follow-up therapies;SNF(going to Rehab at [facility])    Equipment Recommendations  None recommended by PT(pt has RW)    Recommendations for Other Services       Precautions / Restrictions Precautions Precautions: Fall Restrictions Weight Bearing Restrictions: No Other Position/Activity Restrictions: WBAT      Mobility  Bed Mobility Overal bed mobility: Needs Assistance Bed Mobility: Supine to Sit     Supine to sit: Min assist;HOB elevated     General bed mobility comments: cues to use bed rail and assist to bring LE's to EOB and raise trunk upright  Transfers Overall transfer level: Needs assistance Equipment used: Rolling walker (2 wheeled) Transfers: Sit to/from [facility] Sit to Stand: Min assist Stand pivot transfers: Min assist       General transfer comment: cues for technique with RW,  min assist required to initiate and complete power up, assist to steady after rising. pt took several small steps with RW to step to recliner.  Ambulation/Gait Ambulation/Gait assistance: Min assist Gait Distance (Feet): 4 Feet Assistive device: Rolling walker (2 wheeled) Gait Pattern/deviations: Step-to pattern;Narrow base of support;Decreased weight shift to left Gait velocity: decreased   General Gait Details: pt required cues for walker position and step sequencing. pt requried cues to increase BOS in stance. gait limited by "weak" feeling and light headedness. BP assessed once pt returned to sitting. and noted to be 126/95 with HR of 70 and SpO2 at 100% on RA.   Stairs       Wheelchair Mobility    Modified Rankin (Stroke Patients Only)       Balance Overall balance assessment: Needs assistance Sitting-balance support: Feet supported Sitting balance-Leahy Scale: Good     Standing balance support: During functional activity;Bilateral upper extremity supported Standing balance-Leahy Scale: Poor          Pertinent Vitals/Pain Pain Assessment: Faces Faces Pain Scale: Hurts a little bit Pain Location: Lt hip Pain Descriptors / Indicators: Aching;Discomfort Pain Intervention(s): Limited activity within patient's tolerance;Monitored during session;Repositioned;Ice applied    Home Living Family/patient expects to be discharged to:: Private residence(ALF, Pt lives at [facility]) Living Arrangements: Spouse/significant other Available Help at Discharge: Family;Skilled Nursing Facility(plans to dc to [facility] center) Type of Home: Apartment Home Access: Level entry     [place]: One level Independence: [place] - 2 wheels;Grab bars - tub/shower;Grab bars - toilet      Prior Function Level of Independence: Independent  with assistive device(s)         Comments: pt has been using RW since February due to hip pain with mobility     Hand Dominance    Dominant Hand: Right    Extremity/Trunk Assessment   Upper Extremity Assessment Upper Extremity Assessment: Overall WFL for tasks assessed    Lower Extremity Assessment Lower Extremity Assessment: Generalized weakness    Cervical / Trunk Assessment Cervical / Trunk Assessment: Normal  Communication   Communication: HOH  Cognition Arousal/Alertness: Awake/alert Behavior During Therapy: WFL for tasks assessed/performed Overall Cognitive Status: Within Functional Limits for tasks assessed         General Comments      Exercises Total Joint Exercises Ankle Circles/Pumps: AROM;Both;20 reps;Seated   Assessment/Plan    PT Assessment Patient needs continued PT services  PT Problem List Decreased strength;Decreased range of motion;Decreased activity tolerance;Decreased balance;Decreased mobility;Decreased knowledge of use of DME       PT Treatment Interventions DME instruction;Gait training;Stair training;Functional mobility training;Therapeutic activities;Therapeutic exercise;Balance training;Patient/family education    PT Goals (Current goals can be found in the Care Plan section)  Acute Rehab PT Goals Patient Stated Goal: to recover at rehab before getting back to apartment with her husband PT Goal Formulation: With patient Time For Goal Achievement: 04/02/20 Potential to Achieve Goals: Good    Frequency 7X/week    AM-PAC PT "6 Clicks" Mobility  Outcome Measure Help needed turning from your back to your side while in a flat bed without using bedrails?: A Little Help needed moving from lying on your back to sitting on the side of a flat bed without using bedrails?: A Little Help needed moving to and from a bed to a chair (including a wheelchair)?: A Little Help needed standing up from a chair using your arms (e.g., wheelchair or bedside chair)?: A Little Help needed to walk in hospital room?: A Little Help needed climbing 3-5 steps with a railing? : A Lot 6 Click  Score: 17    End of Session Equipment Utilized During Treatment: Gait belt Activity Tolerance: Patient tolerated treatment well Patient left: in chair;with call bell/phone within reach;with chair alarm set Nurse Communication: Mobility status PT Visit Diagnosis: Muscle weakness (generalized) (M62.81);Difficulty in walking, not elsewhere classified (R26.2)    Time: [id number] PT Time Calculation (min) (ACUTE ONLY): 18 min   Charges:   PT Evaluation $PT Eval Low Complexity: 1 Low          Verner Mould, DPT Physical Therapist with [facility] [phone]  03/26/2020 6:00 PM

## 2020-03-26 NOTE — Anesthesia Procedure Notes (Signed)
Procedure Name: MAC Date/Time: 03/26/2020 12:57 PM Performed by: Eben Burow, CRNA Pre-anesthesia Checklist: Patient identified, Emergency Drugs available, Suction available, Patient being monitored and Timeout performed Oxygen Delivery Method: Simple face mask Dental Injury: Teeth and Oropharynx as per pre-operative assessment

## 2020-03-26 NOTE — Anesthesia Preprocedure Evaluation (Addendum)
Anesthesia Evaluation  Patient identified by MRN, date of birth, ID band Patient awake    Reviewed: Allergy & Precautions, NPO status , Patient's Chart, lab work & pertinent test results  Airway Mallampati: I  TM Distance: >3 FB Neck ROM: Full    Dental no notable dental hx. (+) Teeth Intact, Dental Advisory Given   Pulmonary neg pulmonary ROS, former smoker,    Pulmonary exam normal breath sounds clear to auscultation       Cardiovascular hypertension, Pt. on medications Normal cardiovascular exam Rhythm:Regular Rate:Normal     Neuro/Psych negative neurological ROS  negative psych ROS   GI/Hepatic Neg liver ROS, GERD  Medicated,  Endo/Other  negative endocrine ROS  Renal/GU negative Renal ROS  negative genitourinary   Musculoskeletal  (+) Arthritis , S/p spinal fusion   Abdominal   Peds  Hematology negative hematology ROS (+)   Anesthesia Other Findings Taking norco 5/325mg   Reproductive/Obstetrics                            Anesthesia Physical Anesthesia Plan  ASA: II  Anesthesia Plan: Spinal   Post-op Pain Management:    Induction:   PONV Risk Score and Plan: 2 and Treatment may vary due to age or medical condition, Propofol infusion, Ondansetron and Dexamethasone  Airway Management Planned: Natural Airway  Additional Equipment:   Intra-op Plan:   Post-operative Plan:   Informed Consent: I have reviewed the patients History and Physical, chart, labs and discussed the procedure including the risks, benefits and alternatives for the proposed anesthesia with the patient or authorized representative who has indicated his/her understanding and acceptance.     Dental advisory given  Plan Discussed with: CRNA  Anesthesia Plan Comments:         Anesthesia Quick Evaluation

## 2020-03-27 ENCOUNTER — Encounter: Payer: Self-pay | Admitting: *Deleted

## 2020-03-27 DIAGNOSIS — M1612 Unilateral primary osteoarthritis, left hip: Secondary | ICD-10-CM | POA: Diagnosis not present

## 2020-03-27 MED ORDER — TIZANIDINE HCL 2 MG PO TABS: 2.0000 mg | ORAL_TABLET | Freq: Four times a day (QID) | ORAL | 1 refills | Status: DC | PRN

## 2020-03-27 MED ORDER — ASPIRIN 81 MG PO CHEW: 81.0000 mg | CHEWABLE_TABLET | Freq: Two times a day (BID) | ORAL | 0 refills | Status: DC

## 2020-03-27 MED ORDER — HYDROCODONE-ACETAMINOPHEN 5-325 MG PO TABS: 1.0000 | ORAL_TABLET | Freq: Four times a day (QID) | ORAL | 0 refills | Status: AC | PRN

## 2020-03-27 MED ORDER — ASPIRIN 81 MG PO CHEW: 81.0000 mg | CHEWABLE_TABLET | Freq: Two times a day (BID) | ORAL | 0 refills | Status: AC

## 2020-03-27 MED ORDER — TIZANIDINE HCL 2 MG PO TABS: 2.0000 mg | ORAL_TABLET | Freq: Four times a day (QID) | ORAL | 1 refills | Status: AC | PRN

## 2020-03-27 MED ORDER — HYDROCODONE-ACETAMINOPHEN 5-325 MG PO TABS: 1.0000 | ORAL_TABLET | Freq: Four times a day (QID) | ORAL | 0 refills | Status: DC | PRN

## 2020-03-27 NOTE — TOC Initial Note (Signed)
Transition of Care [facility]) - Initial/Assessment Note    Patient Details  Name: Theresa Byrd MRN: [id number] Date of Birth: 1936-09-01  Transition of Care [facility]) CM/SW Contact:    Lia Hopping, [place] Phone Number: 03/27/2020, 10:46 AM  Clinical Narrative:                Patient admitted for TKA.  Patient from [facility] independent with her spouse and will discharge to SNF area for rehab. Patient reports she will use her "[facility]" for rehab and will not use her insurance. Patient has requested [facility] to transport her. Patient express feeling [place].  CSW confirmed with admission coordinator Luellen Pucker, the facility is ready to accept the patient.  FL2 completed.   Expected Discharge Plan: Skilled Nursing Facility Barriers to Discharge: No Barriers Identified   Patient Goals and CMS Choice Patient states their goals for this hospitalization and ongoing recovery are:: Return to [facility] community for rehab   Choice offered to / list presented to : NA  Expected Discharge Plan and Services Expected Discharge Plan: [place] In-house Referral: NA Discharge Planning Services: NA Post Acute Care Choice: [place] Living arrangements for the past 2 months: [place] Expected Discharge Date: 03/27/20               DME Arranged: N/A DME Agency: NA       HH Arranged: NA HH Agency: NA        Prior Living Arrangements/Services Living arrangements for the past 2 months: [place] Lives with:: Facility Resident Patient language and need for interpreter reviewed:: No Do you feel safe going back to the place where you live?: Yes      Need for Family Participation in Patient Care: Yes (Comment) Care giver support system in place?: Yes (comment)   Criminal Activity/Legal Involvement Pertinent to Current Situation/Hospitalization: No - Comment as  needed  Activities of Daily Living Home Assistive Devices/Equipment: Eyeglasses, Hearing aid, Environmental consultant (specify type), Cane (specify quad or straight) ADL Screening (condition at time of admission) Patient's cognitive ability adequate to safely complete daily activities?: Yes Is the patient deaf or have difficulty hearing?: Yes Does the patient have difficulty seeing, even when wearing glasses/contacts?: No Does the patient have difficulty concentrating, remembering, or making decisions?: No Patient able to express need for assistance with ADLs?: Yes Does the patient have difficulty dressing or bathing?: No Independently performs ADLs?: Yes (appropriate for developmental age) Does the patient have difficulty walking or climbing stairs?: Yes Weakness of Legs: None Weakness of Arms/Hands: None  Permission Sought/Granted Permission sought to share information with : Facility Art therapist granted to share information with : Yes, Verbal Permission Granted     Permission granted to share info w AGENCY: [facility]        Emotional Assessment Appearance:: Appears stated age Attitude/Demeanor/Rapport: Engaged Affect (typically observed): Accepting, Pleasant Orientation: : Oriented to Situation, Oriented to  Time, Oriented to Place, Oriented to Self Alcohol / Substance Use: Not Applicable Psych Involvement: No (comment)  Admission diagnosis:  Primary osteoarthritis of left hip [M16.12] Patient Active Problem List   Diagnosis Date Noted  . Primary localized osteoarthritis of left hip 03/26/2020   PCP:  Linus Mako, NP Pharmacy:   [facility] [employer] - [address] [place] [address] Phone: [phone] Fax: [phone]     Social Determinants of Health (SDOH) Interventions  Readmission Risk Interventions No flowsheet data found.

## 2020-03-27 NOTE — Anesthesia Postprocedure Evaluation (Signed)
Anesthesia Post Note  Patient: Emmelia Holdsworth  Procedure(s) Performed: LEFT TOTAL HIP ARTHROPLASTY ANTERIOR APPROACH (Left Hip)     Patient location during evaluation: PACU Anesthesia Type: Spinal and MAC Level of consciousness: oriented and awake and alert Pain management: pain level controlled Vital Signs Assessment: post-procedure vital signs reviewed and stable Respiratory status: spontaneous breathing, respiratory function stable and patient connected to nasal cannula oxygen Cardiovascular status: blood pressure returned to baseline and stable Postop Assessment: no headache, no backache and no apparent nausea or vomiting Anesthetic complications: no    Last Vitals:  Vitals:   03/27/20 0557 03/27/20 1014  BP: (!) 119/55 (!) 116/58  Pulse: 67 69  Resp: 16 18  Temp: 36.7 C 36.9 C  SpO2: 97% 98%    Last Pain:  Vitals:   03/27/20 0915  TempSrc:   PainSc: [place]

## 2020-03-27 NOTE — Discharge Summary (Signed)
Patient ID: Theresa Byrd MRN: [id number] DOB/AGE: 1936/05/02 84 y.o.  Admit date: 03/26/2020 Discharge date: 03/27/2020  Admission Diagnoses:  Principal Problem:   Primary localized osteoarthritis of left hip   Discharge Diagnoses:  Same  Past Medical History:  Diagnosis Date  . Arthritis    Rheumatoid  and psoriatic  . Cancer ([place])    skin cancer  . GERD (gastroesophageal reflux disease)   . Hypertension   . Severe hearing loss   . Walking pneumonia     Surgeries: Procedure(s): LEFT TOTAL HIP ARTHROPLASTY ANTERIOR APPROACH on 03/26/2020   Consultants:   Discharged Condition: Improved  Hospital Course: Theresa Byrd is an 84 y.o. female who was admitted 03/26/2020 for operative treatment ofPrimary localized osteoarthritis of left hip. Patient has severe unremitting pain that affects sleep, daily activities, and work/hobbies. After pre-op clearance the patient was taken to the operating room on 03/26/2020 and underwent  Procedure(s): LEFT TOTAL HIP ARTHROPLASTY ANTERIOR APPROACH.    Patient was given perioperative antibiotics:  Anti-infectives (From admission, onward)   Start     Dose/Rate Route Frequency Ordered Stop   03/27/20 2359  vancomycin (VANCOCIN) IVPB 1000 mg/200 mL premix     1,000 mg 200 mL/hr over 60 Minutes Intravenous Every 12 hours 03/26/20 1616 03/28/20 1159   03/26/20 0945  ceFAZolin (ANCEF) IVPB 2g/100 mL premix     2 g 200 mL/hr over 30 Minutes Intravenous On call to O.R. 03/26/20 [id number] 03/26/20 1301   03/26/20 0939  vancomycin (VANCOCIN) IVPB 1000 mg/200 mL premix     1,000 mg 200 mL/hr over 60 Minutes Intravenous 30 min pre-op 03/26/20 0939 03/26/20 1243       Patient was given sequential compression devices, early ambulation, and chemoprophylaxis to prevent DVT.  Patient benefited maximally from hospital stay and there were no complications.    Recent vital signs:  Patient Vitals for the past 24 hrs:  BP Temp Temp src Pulse Resp SpO2  Height Weight  03/27/20 0557 (!) 119/55 98 F (36.7 C) Oral 67 16 97 % -- --  03/27/20 0212 119/63 98.1 F (36.7 C) Oral 63 16 97 % -- --  03/26/20 1926 117/64 98 F (36.7 C) Oral 71 16 98 % -- --  03/26/20 1716 119/65 97.8 F (36.6 C) Oral 78 16 100 % -- --  03/26/20 1609 125/71 97.6 F (36.4 C) Oral 62 18 100 % 5\' 6"  (1.676 m) 70.3 kg  03/26/20 1545 122/64 98 F (36.7 C) -- 65 16 100 % -- --  03/26/20 1530 102/65 -- -- 66 (!) 23 100 % -- --  03/26/20 1515 104/88 -- -- 71 16 100 % -- --  03/26/20 1500 123/68 -- -- 68 16 100 % -- --  03/26/20 1454 (!) 130/103 (!) 97.4 F (36.3 C) -- 75 16 100 % -- --  03/26/20 1001 (!) 149/78 98.3 F (36.8 C) Oral 68 16 98 % -- --     Recent laboratory studies: No results for input(s): WBC, HGB, HCT, PLT, NA, K, CL, CO2, BUN, CREATININE, GLUCOSE, INR, CALCIUM in the last 72 hours.  Invalid input(s): PT, 2   Discharge Medications:   Allergies as of 03/27/2020   No Known Allergies     Medication List    TAKE these medications   aspirin 81 MG chewable tablet Chew 1 tablet (81 mg total) by mouth 2 (two) times daily.   CALCIUM 600+D3 PO Take 1 tablet by mouth daily.   cholecalciferol 25 MCG (1000  UNIT) tablet Commonly known as: VITAMIN D Take 1,000 Units by mouth daily.   [employer] Vitamin C 500 MG tablet Generic drug: ascorbic acid Take 500 mg by mouth daily.   HAIR/SKIN/NAILS/BIOTIN PO Take 1 tablet by mouth daily.   hydrochlorothiazide 12.5 MG tablet Commonly known as: HYDRODIURIL Take 12.5 mg by mouth daily.   HYDROcodone-acetaminophen 5-325 MG tablet Commonly known as: NORCO/VICODIN Take 1 tablet by mouth every 6 (six) hours as needed for moderate pain or severe pain (post op pain). What changed:   when to take this  reasons to take this   multivitamin with minerals Tabs tablet Take 1 tablet by mouth daily. Centrum Silver   omeprazole 20 MG capsule Commonly known as: PRILOSEC Take 20 mg by mouth daily before breakfast.    tiZANidine 2 MG tablet Commonly known as: ZANAFLEX Take 1-2 tablets (2-4 mg total) by mouth every 6 (six) hours as needed for muscle spasms.   traMADol 50 MG tablet Commonly known as: ULTRAM Take 50 mg by mouth daily as needed (breakthrough hip pain.).   Xeljanz 5 MG Tabs Generic drug: Tofacitinib Citrate Take 5 mg by mouth in the morning and at bedtime. Per pt. Dr. Stop I week prior to surgery and hold 3 weeks after surgery recommended            Durable Medical Equipment  (From admission, onward)         Start     Ordered   03/26/20 1617  DME Walker rolling  Once    Question:  Patient needs a walker to treat with the following condition  Answer:  Primary osteoarthritis of left hip   03/26/20 1616   03/26/20 1617  DME 3 n 1  Once     03/26/20 1616   03/26/20 1617  DME Bedside commode  Once    Question:  Patient needs a bedside commode to treat with the following condition  Answer:  Primary osteoarthritis of left hip   03/26/20 1616          Diagnostic Studies: DG Chest 2 View  Result Date: 03/16/2020 CLINICAL DATA:  Preoperative evaluation. EXAM: CHEST - 2 VIEW COMPARISON:  None. FINDINGS: There is no evidence of acute infiltrate, pleural effusion or pneumothorax. The heart size and mediastinal contours are within normal limits. Radiopaque pedicle screws are seen within the visualized portion of the mid lumbar spine. The visualized skeletal structures are otherwise unremarkable. IMPRESSION: No active cardiopulmonary disease. Electronically Signed   By: Virgina Norfolk M.D.   On: 03/16/2020 19:15   DG Pelvis Portable  Result Date: 03/26/2020 CLINICAL DATA:  84 year old female status post left hip replacement. EXAM: PORTABLE PELVIS 1-2 VIEWS COMPARISON:  Intraoperative images 1219 hours today. Left hip series 04/20/2019. FINDINGS: Portable AP view at 1538 hours. Bilateral hip arthroplasty is present, new on the left with bipolar type arthroplasty on that side. Normal AP  alignment. No unexpected osseous changes. Pelvis appears stable. Mild postoperative soft tissue gas about the left hip. Chronic left side lower lumbar and lumbosacral endplate degeneration. IMPRESSION: New left hip arthroplasty with no adverse features. Electronically Signed   By: Genevie Ann M.D.   On: 03/26/2020 16:13   DG C-Arm 1-60 Min-No Report  Result Date: 03/26/2020 Fluoroscopy was utilized by the requesting physician.  No radiographic interpretation.   DG HIP OPERATIVE UNILAT WITH PELVIS LEFT  Result Date: 03/26/2020 CLINICAL DATA:  84 year old female status post left hip replacement. EXAM: OPERATIVE LEFT HIP (WITH PELVIS IF PERFORMED)  4 VIEWS TECHNIQUE: Fluoroscopic spot image(s) were submitted for interpretation post-operatively. COMPARISON:  Left hip series 04/20/2019 FLUOROSCOPY TIME:  0 minutes 17 seconds FINDINGS: Four intraoperative fluoroscopic AP spot views of the left hip and lower pelvis demonstrate placement of left bipolar hip arthroplasty. Normal hardware EP alignment. Contralateral right hip arthroplasty also present. No unexpected osseous changes identified. IMPRESSION: Intraoperative images of left hip arthroplasty with no adverse features. Electronically Signed   By: Genevie Ann M.D.   On: 03/26/2020 16:13    Disposition: Discharge disposition: 01-Home or Self Care       Discharge Instructions    Call MD / Call 911   Complete by: As directed    If you experience chest pain or shortness of breath, CALL 911 and be transported to the hospital emergency room.  If you develope a fever above 101 F, pus (white drainage) or increased drainage or redness at the wound, or calf pain, call your surgeon's office.   Constipation Prevention   Complete by: As directed    Drink plenty of fluids.  Prune juice may be helpful.  You may use a stool softener, such as Colace (over the counter) 100 mg twice a day.  Use MiraLax (over the counter) for constipation as needed.   Diet - low sodium  heart healthy   Complete by: As directed    Discharge instructions   Complete by: As directed    INSTRUCTIONS AFTER JOINT REPLACEMENT   Remove items at home which could result in a fall. This includes throw rugs or furniture in walking pathways ICE to the affected joint every three hours while awake for 30 minutes at a time, for at least the first 3-5 days, and then as needed for pain and swelling.  Continue to use ice for pain and swelling. You may notice swelling that will progress down to the foot and ankle.  This is normal after surgery.  Elevate your leg when you are not up walking on it.   Continue to use the breathing machine you got in the hospital (incentive spirometer) which will help keep your temperature down.  It is common for your temperature to cycle up and down following surgery, especially at night when you are not up moving around and exerting yourself.  The breathing machine keeps your lungs expanded and your temperature down.   DIET:  As you were doing prior to hospitalization, we recommend a well-balanced diet.  DRESSING / WOUND CARE / SHOWERING  You may shower 3 days after surgery, but keep the wounds dry during showering.  You may use an occlusive plastic wrap (Press'n Seal for example), NO SOAKING/SUBMERGING IN THE BATHTUB.  If the bandage gets wet, change with a clean dry gauze.  If the incision gets wet, pat the wound dry with a clean towel.  ACTIVITY  Increase activity slowly as tolerated, but follow the weight bearing instructions below.   No driving for 6 weeks or until further direction given by your physician.  You cannot drive while taking narcotics.  No lifting or carrying greater than 10 lbs. until further directed by your surgeon. Avoid periods of inactivity such as sitting longer than an hour when not asleep. This helps prevent blood clots.  You may return to work once you are authorized by your doctor.     WEIGHT BEARING   Weight bearing as tolerated  with assist device (walker, cane, etc) as directed, use it as long as suggested by your surgeon or therapist,  typically at least 4-6 weeks.   EXERCISES  Results after joint replacement surgery are often greatly improved when you follow the exercise, range of motion and muscle strengthening exercises prescribed by your doctor. Safety measures are also important to protect the joint from further injury. Any time any of these exercises cause you to have increased pain or swelling, decrease what you are doing until you are comfortable again and then slowly increase them. If you have problems or questions, call your caregiver or physical therapist for advice.   Rehabilitation is important following a joint replacement. After just a few days of immobilization, the muscles of the leg can become weakened and shrink (atrophy).  These exercises are designed to build up the tone and strength of the thigh and leg muscles and to improve motion. Often times heat used for twenty to thirty minutes before working out will loosen up your tissues and help with improving the range of motion but do not use heat for the first two weeks following surgery (sometimes heat can increase post-operative swelling).   These exercises can be done on a training (exercise) mat, on the floor, on a table or on a bed. Use whatever works the best and is most comfortable for you.    Use music or television while you are exercising so that the exercises are a pleasant break in your day. This will make your life better with the exercises acting as a break in your routine that you can look forward to.   Perform all exercises about fifteen times, three times per day or as directed.  You should exercise both the operative leg and the other leg as well.   Exercises include:   Quad Sets - Tighten up the muscle on the front of the thigh (Quad) and hold for 5-10 seconds.   Straight Leg Raises - With your knee straight (if you were given a brace,  keep it on), lift the leg to 60 degrees, hold for 3 seconds, and slowly lower the leg.  Perform this exercise against resistance later as your leg gets stronger.  Leg Slides: Lying on your back, slowly slide your foot toward your buttocks, bending your knee up off the floor (only go as far as is comfortable). Then slowly slide your foot back down until your leg is flat on the floor again.  Angel Wings: Lying on your back spread your legs to the side as far apart as you can without causing discomfort.  Hamstring Strength:  Lying on your back, push your heel against the floor with your leg straight by tightening up the muscles of your buttocks.  Repeat, but this time bend your knee to a comfortable angle, and push your heel against the floor.  You may put a pillow under the heel to make it more comfortable if necessary.   A rehabilitation program following joint replacement surgery can speed recovery and prevent re-injury in the future due to weakened muscles. Contact your doctor or a physical therapist for more information on knee rehabilitation.    CONSTIPATION  Constipation is defined medically as fewer than three stools per week and severe constipation as less than one stool per week.  Even if you have a regular bowel pattern at home, your normal regimen is likely to be disrupted due to multiple reasons following surgery.  Combination of anesthesia, postoperative narcotics, change in appetite and fluid intake all can affect your bowels.   YOU MUST use at least one of the following  options; they are listed in order of increasing strength to get the job done.  They are all available over the counter, and you may need to use some, POSSIBLY even all of these options:    Drink plenty of fluids (prune juice may be helpful) and high fiber foods Colace 100 mg by mouth twice a day  Senokot for constipation as directed and as needed Dulcolax (bisacodyl), take with full glass of water  Miralax (polyethylene  glycol) once or twice a day as needed.  If you have tried all these things and are unable to have a bowel movement in the first 3-4 days after surgery call either your surgeon or your primary doctor.    If you experience loose stools or diarrhea, hold the medications until you stool forms back up.  If your symptoms do not get better within 1 week or if they get worse, check with your doctor.  If you experience "the worst abdominal pain ever" or develop nausea or vomiting, please contact the office immediately for further recommendations for treatment.   ITCHING:  If you experience itching with your medications, try taking only a single pain pill, or even half a pain pill at a time.  You can also use Benadryl over the counter for itching or also to help with sleep.   TED HOSE STOCKINGS:  Use stockings on both legs until for at least 2 weeks or as directed by physician office. They may be removed at night for sleeping.  MEDICATIONS:  See your medication summary on the "After Visit Summary" that nursing will review with you.  You may have some home medications which will be placed on hold until you complete the course of blood thinner medication.  It is important for you to complete the blood thinner medication as prescribed.  PRECAUTIONS:  If you experience chest pain or shortness of breath - call 911 immediately for transfer to the hospital emergency department.   If you develop a fever greater that 101 F, purulent drainage from wound, increased redness or drainage from wound, foul odor from the wound/dressing, or calf pain - CONTACT YOUR SURGEON.                                                   FOLLOW-UP APPOINTMENTS:  If you do not already have a post-op appointment, please call the office for an appointment to be seen by your surgeon.  Guidelines for how soon to be seen are listed in your "After Visit Summary", but are typically between 1-4 weeks after surgery.  OTHER INSTRUCTIONS:   Knee  Replacement:  Do not place pillow under knee, focus on keeping the knee straight while resting. CPM instructions: 0-90 degrees, 2 hours in the morning, 2 hours in the afternoon, and 2 hours in the evening. Place foam block, curve side up under heel at all times except when in CPM or when walking.  DO NOT modify, tear, cut, or change the foam block in any way.   DENTAL ANTIBIOTICS:  In most cases prophylactic antibiotics for Dental procdeures after total joint surgery are not necessary.  Exceptions are as follows:  1. History of prior total joint infection  2. Severely immunocompromised (Organ Transplant, cancer chemotherapy, Rheumatoid biologic meds such as [place])  3. Poorly controlled diabetes (A1C &gt; 8.0, blood glucose over 200)  If you have one of these conditions, contact your surgeon for an antibiotic prescription, prior to your dental procedure.   MAKE SURE YOU:  Understand these instructions.  Get help right away if you are not doing well or get worse.    Thank you for letting us be a part of your medical care team.  It is a privilege we respect greatly.  We hope these instructions will help you stay on track for a fast and full recovery!   Increase activity slowly as tolerated   Complete by: As directed       Follow-up Information    Melrose Nakayama, MD. Schedule an appointment as soon as possible for a visit in 2 weeks.   Specialty: Orthopedic Surgery Contact information: [place] [address] [phone]            Signed: Larwance Sachs Keyla Milone 03/27/2020, 8:16 AM

## 2020-03-27 NOTE — Progress Notes (Signed)
Subjective: 1 Day Post-Op Procedure(s) (LRB): LEFT TOTAL HIP ARTHROPLASTY ANTERIOR APPROACH (Left)   Patient feels well. SHe is hoping to go back home to [facility] today.  Activity level:  wbat Diet tolerance:  ok Voiding:  ok Patient reports pain as mild.    Objective: Vital signs in last 24 hours: Temp:  [97.4 F (36.3 C)-98.3 F (36.8 C)] 98 F (36.7 C) (04/21 0557) Pulse Rate:  [62-78] 67 (04/21 0557) Resp:  [16-23] 16 (04/21 0557) BP: (102-149)/(55-103) 119/55 (04/21 0557) SpO2:  [97 %-100 %] 97 % (04/21 0557) Weight:  [70.3 kg] 70.3 kg (04/20 1609)  Labs: No results for input(s): HGB in the last 72 hours. No results for input(s): WBC, RBC, HCT, PLT in the last 72 hours. No results for input(s): NA, K, CL, CO2, BUN, CREATININE, GLUCOSE, CALCIUM in the last 72 hours. No results for input(s): LABPT, INR in the last 72 hours.  Physical Exam:  Neurologically intact ABD soft Neurovascular intact Sensation intact distally Intact pulses distally Dorsiflexion/Plantar flexion intact Incision: dressing C/D/I and no drainage No cellulitis present Compartment soft  Assessment/Plan:  1 Day Post-Op Procedure(s) (LRB): LEFT TOTAL HIP ARTHROPLASTY ANTERIOR APPROACH (Left) Advance diet Up with therapy Discharge home with home health back to [facility] today if cleared by PT. Continue on 81mg  asa BID x 4 weeks post op. Follow up in office 2 weeks post op.  Larwance Sachs Jahnessa Vanduyn 03/27/2020, 8:11 AM

## 2020-03-27 NOTE — Progress Notes (Signed)
Report given to RN at [facility].

## 2020-03-27 NOTE — TOC Transition Note (Addendum)
Transition of Care [facility]) - CM/SW Discharge Note   Patient Details  Name: Miani Albach MRN: [id number] Date of Birth: 1935/12/28  Transition of Care [facility]) CM/SW Contact:  Lia Hopping, [place] Phone Number: 03/27/2020, 11:42 AM   Clinical Narrative:    [facility] Ready to accept today.  Nurse call report to: [phone] Room: 302  1:45PM [facility] arranged to transport.    Final next level of care: Skilled Nursing Facility Barriers to Discharge: Barriers Resolved   Patient Goals and CMS Choice Patient states their goals for this hospitalization and ongoing recovery are:: Return to [facility] community for rehab   Choice offered to / list presented to : NA  Discharge Placement PASRR number recieved: 03/27/20            Patient chooses bed at: [facility] Patient to be transferred to facility by: [place] Name of family member notified: Patient to notify spouse Patient and family notified of of transfer: 03/27/20  Discharge Plan and Services In-house Referral: NA Discharge Planning Services: NA Post Acute Care Choice: [place]          DME Arranged: N/A DME Agency: NA       HH Arranged: NA HH Agency: NA        Social Determinants of Health (SDOH) Interventions     Readmission Risk Interventions No flowsheet data found.

## 2020-03-27 NOTE — NC FL2 (Addendum)
[place] LEVEL OF CARE SCREENING TOOL     IDENTIFICATION  Patient Name: Theresa Byrd Birthdate: [birth year] Sex: female Admission Date (Current Location): 03/26/2020  [facility] and [place] Number:  Herbalist and Address:  [facility],  [address], [place]      Provider Number: [id number]  Attending Physician Name and Address:  Melrose Nakayama, MD  Relative Name and Phone Number:    NEENA, DELONE [phone]  [phone]      Current Level of Care: Hospital Recommended Level of Care: [place] Prior Approval Number:    Date Approved/Denied:   PASRR Number:   [id number]  Discharge Plan: SNF    Current Diagnoses: Patient Active Problem List   Diagnosis Date Noted  . Primary localized osteoarthritis of left hip 03/26/2020    Orientation RESPIRATION BLADDER Height & Weight     Self, Time, Situation, Place    Continent Weight: 154 lb 15.7 oz (70.3 kg) Height:  5\' 6"  (167.6 cm)  BEHAVIORAL SYMPTOMS/MOOD NEUROLOGICAL BOWEL NUTRITION STATUS      Continent    AMBULATORY STATUS COMMUNICATION OF NEEDS Skin   Extensive Assist Verbally                         Personal Care Assistance Level of Assistance  Bathing, Feeding, Dressing Bathing Assistance: Limited assistance Feeding assistance: Independent Dressing Assistance: Limited assistance     Functional Limitations Info  Sight, Hearing, Speech Sight Info: Impaired(Wears Glasses) Hearing Info: Impaired Speech Info: Adequate    SPECIAL CARE FACTORS FREQUENCY  PT (By licensed PT), OT (By licensed OT)     PT Frequency: 5X/WEEK OT Frequency: 5X/WEEK            Contractures Contractures Info: Not present    Additional Factors Info  Code Status, Allergies Code Status Info: FULLCODE Allergies Info: Allergies: No Known Allergies           Current Medications (03/27/2020):  This is the current hospital active medication  list Current Facility-Administered Medications  Medication Dose Route Frequency Provider Last Rate Last Admin  . acetaminophen (TYLENOL) tablet 325-650 mg  325-650 mg Oral Q6H PRN Loni Dolly, PA-C      . acetaminophen (TYLENOL) tablet 500 mg  500 mg Oral Q6H Loni Dolly, PA-C   500 mg at 03/27/20 0606  . alum & mag hydroxide-simeth (MAALOX/MYLANTA) 200-200-20 MG/5ML suspension 30 mL  30 mL Oral Q4H PRN Loni Dolly, PA-C      . aspirin chewable tablet 81 mg  81 mg Oral BID Nida, Andrew, PA-C      . bisacodyl (DULCOLAX) EC tablet 5 mg  5 mg Oral Daily PRN Loni Dolly, PA-C      . diphenhydrAMINE (BENADRYL) 12.5 MG/5ML elixir 12.5-25 mg  12.5-25 mg Oral Q4H PRN Loni Dolly, PA-C      . docusate sodium (COLACE) capsule 100 mg  100 mg Oral BID Loni Dolly, PA-C   100 mg at 03/27/20 0819  . hydrochlorothiazide (HYDRODIURIL) tablet 12.5 mg  12.5 mg Oral Daily Loni Dolly, PA-C   12.5 mg at 03/27/20 0818  . HYDROcodone-acetaminophen (NORCO) 7.5-325 MG per tablet 1-2 tablet  1-2 tablet Oral Q4H PRN Loni Dolly, PA-C      . HYDROcodone-acetaminophen (NORCO/VICODIN) 5-325 MG per tablet 1-2 tablet  1-2 tablet Oral Q4H PRN Loni Dolly, PA-C   2 tablet at 03/27/20 0819  . ketorolac (TORADOL) 15 MG/ML injection 7.5 mg  7.5 mg Intravenous Q6H Loni Dolly, PA-C   7.5 mg at 03/27/20 0606  . lactated ringers infusion   Intravenous Continuous Loni Dolly, PA-C 75 mL/hr at 03/27/20 0414 New Bag at 03/27/20 0414  . menthol-cetylpyridinium (CEPACOL) lozenge 3 mg  1 lozenge Oral PRN Loni Dolly, PA-C       Or  . phenol (CHLORASEPTIC) mouth spray 1 spray  1 spray Mouth/Throat PRN Loni Dolly, PA-C      . methocarbamol (ROBAXIN) tablet 500 mg  500 mg Oral Q6H PRN Loni Dolly, PA-C       Or  . methocarbamol (ROBAXIN) 500 mg in dextrose 5 % 50 mL IVPB  500 mg Intravenous Q6H PRN Loni Dolly, PA-C   Stopped at 03/26/20 1513  . metoCLOPramide (REGLAN) tablet 5-10 mg  5-10 mg Oral Q8H PRN Loni Dolly, PA-C        Or  . metoCLOPramide (REGLAN) injection 5-10 mg  5-10 mg Intravenous Q8H PRN Loni Dolly, PA-C      . morphine 2 MG/ML injection 0.5-1 mg  0.5-1 mg Intravenous Q2H PRN Loni Dolly, PA-C      . ondansetron [facility]) tablet 4 mg  4 mg Oral Q6H PRN Loni Dolly, PA-C       Or  . ondansetron (ZOFRAN) injection 4 mg  4 mg Intravenous Q6H PRN Loni Dolly, PA-C      . pantoprazole (PROTONIX) EC tablet 40 mg  40 mg Oral Daily Loni Dolly, PA-C   40 mg at 03/27/20 0819  . vancomycin (VANCOCIN) IVPB 1000 mg/200 mL premix  1,000 mg Intravenous Q12H Loni Dolly, PA-C         Discharge Medications: Please see discharge summary for a list of discharge medications.  Relevant Imaging Results:  Relevant Lab Results:   Additional Information [id number]  Lia Hopping, LCSW

## 2020-03-27 NOTE — Progress Notes (Signed)
Physical Therapy Treatment Patient Details Name: Theresa Byrd MRN: [id number] DOB: [birth year] Today's Date: 03/27/2020    History of Present Illness Patient is 84 y.o. female s/p Lt THA anterior approach on 03/26/20 with PMH significant for PNA, HOH, HTN, GERD, OA, Rt tHA, spinal fusion in 2001.    PT Comments    Patient is progressing well with therapy. She was instructed on bed mobility technique with gait belt and was able to complete supine to sit transfer with min guard and gait belt to assist Lt LE today. No dizziness upon sitting reported. Pt requires light min assist to initiate power up from low surface and is steady with rising. She was able to increased gait distance to ~280' today with RW and no overt LOB. Patient educated on exercises for HEP in seated position. She will continue to benefit from skilled PT interventions to progress functional independence with mobility and is planning to discharge to rehab setting at [facility]. Patient is safe to discharge to next venue based on mobility. Acute PT will follow and progress as able.   Follow Up Recommendations  Follow surgeon's recommendation for DC plan and follow-up therapies;SNF(going to Rehab at [facility])     Equipment Recommendations  None recommended by PT(pt has RW)    Recommendations for Other Services       Precautions / Restrictions Precautions Precautions: Fall Restrictions Weight Bearing Restrictions: No Other Position/Activity Restrictions: WBAT    Mobility  Bed Mobility Overal bed mobility: Needs Assistance Bed Mobility: Supine to Sit     Supine to sit: HOB elevated;Min guard     General bed mobility comments: cues provided for use of gait belt to mobilize Lt LE. Pt able to bring bil LE's to EOB and raise trunk, pt has adjustable bed at home and [facility] left elevated. no physical assist needed.   Transfers Overall transfer level: Needs assistance Equipment used: Rolling walker (2  wheeled) Transfers: Sit to/from Stand Sit to Stand: Min assist         General transfer comment: pt wtih good recall for technique with RW, light assist needed to initiate power up and pt able to complete rise on her own. pt steady upon standing.   Ambulation/Gait Ambulation/Gait assistance: Min assist Gait Distance (Feet): 280 Feet Assistive device: Rolling walker (2 wheeled) Gait Pattern/deviations: Step-to pattern;Decreased stride length Gait velocity: decreased   General Gait Details: pt maintained safe proximity to walker throughout, intermittent cues for safe hand placement on RW as pt had tendency to lift 1 hand while talking. Pt denied dizziness, lightheaded sensation, or weak feeling during gait. Pt is slow but steady.   Stairs          Wheelchair Mobility    Modified Rankin (Stroke Patients Only)       Balance Overall balance assessment: Needs assistance Sitting-balance support: Feet supported Sitting balance-Leahy Scale: Good     Standing balance support: During functional activity;Bilateral upper extremity supported Standing balance-Leahy Scale: Fair         Cognition Arousal/Alertness: Awake/alert Behavior During Therapy: WFL for tasks assessed/performed Overall Cognitive Status: Within Functional Limits for tasks assessed       Exercises Total Joint Exercises Ankle Circles/Pumps: AROM;Both;20 reps;Seated Quad Sets: AROM;Left;5 reps;Seated Short Arc Quad: AROM;Left;5 reps;Seated Heel Slides: AROM;AAROM;Left;5 reps;Seated Hip ABduction/ADduction: AROM;Left;5 reps;Seated Long Arc Quad: AROM;Left;10 reps;Seated    General Comments        Pertinent Vitals/Pain Pain Assessment: 0-10 Pain Score: 2  Pain Location: Lt hip Pain  Descriptors / Indicators: Aching;Discomfort Pain Intervention(s): Limited activity within patient's tolerance;Monitored during session;Repositioned    Home Living Family/patient expects to be discharged to:: Private  residence(ALF, Pt lives at [facility]) Living Arrangements: Spouse/significant other Available Help at Discharge: Family;Skilled Nursing Facility(plans to dc to [facility] center) Type of Home: Apartment Home Access: Level entry   Home Layout: One level Home Equipment: Environmental consultant - 2 wheels;Grab bars - tub/shower;Grab bars - toilet      Prior Function Level of Independence: Independent with assistive device(s)      Comments: pt has been using RW since February due to hip pain with mobility   PT Goals (current goals can now be found in the care plan section) Acute Rehab PT Goals Patient Stated Goal: to recover at rehab before getting back to apartment with her husband PT Goal Formulation: With patient Time For Goal Achievement: 04/02/20 Potential to Achieve Goals: Good Progress towards PT goals: Progressing toward goals    Frequency    7X/week      PT Plan Current plan remains appropriate       AM-PAC PT "6 Clicks" Mobility   Outcome Measure  Help needed turning from your back to your side while in a flat bed without using bedrails?: A Little Help needed moving from lying on your back to sitting on the side of a flat bed without using bedrails?: A Little Help needed moving to and from a bed to a chair (including a wheelchair)?: A Little Help needed standing up from a chair using your arms (e.g., wheelchair or bedside chair)?: A Little Help needed to walk in hospital room?: A Little Help needed climbing 3-5 steps with a railing? : A Lot 6 Click Score: 17    End of Session Equipment Utilized During Treatment: Gait belt Activity Tolerance: Patient tolerated treatment well Patient left: in chair;with call bell/phone within reach;with chair alarm set Nurse Communication: Mobility status PT Visit Diagnosis: Muscle weakness (generalized) (M62.81);Difficulty in walking, not elsewhere classified (R26.2)     Time: [id number] PT Time Calculation (min) (ACUTE ONLY):  31 min  Charges:  $Gait Training: 8-22 mins $Therapeutic Exercise: 8-22 mins                     Verner Mould, DPT Physical Therapist with [facility] [phone]  03/27/2020 10:29 AM

## 2021-04-18 IMAGING — DX DG CHEST 2V
2 series · 2 of 2 positions shown · non-contrast
Comparison: None.

CLINICAL DATA: Preoperative evaluation.

EXAM:
CHEST - 2 VIEW

[chest pa]
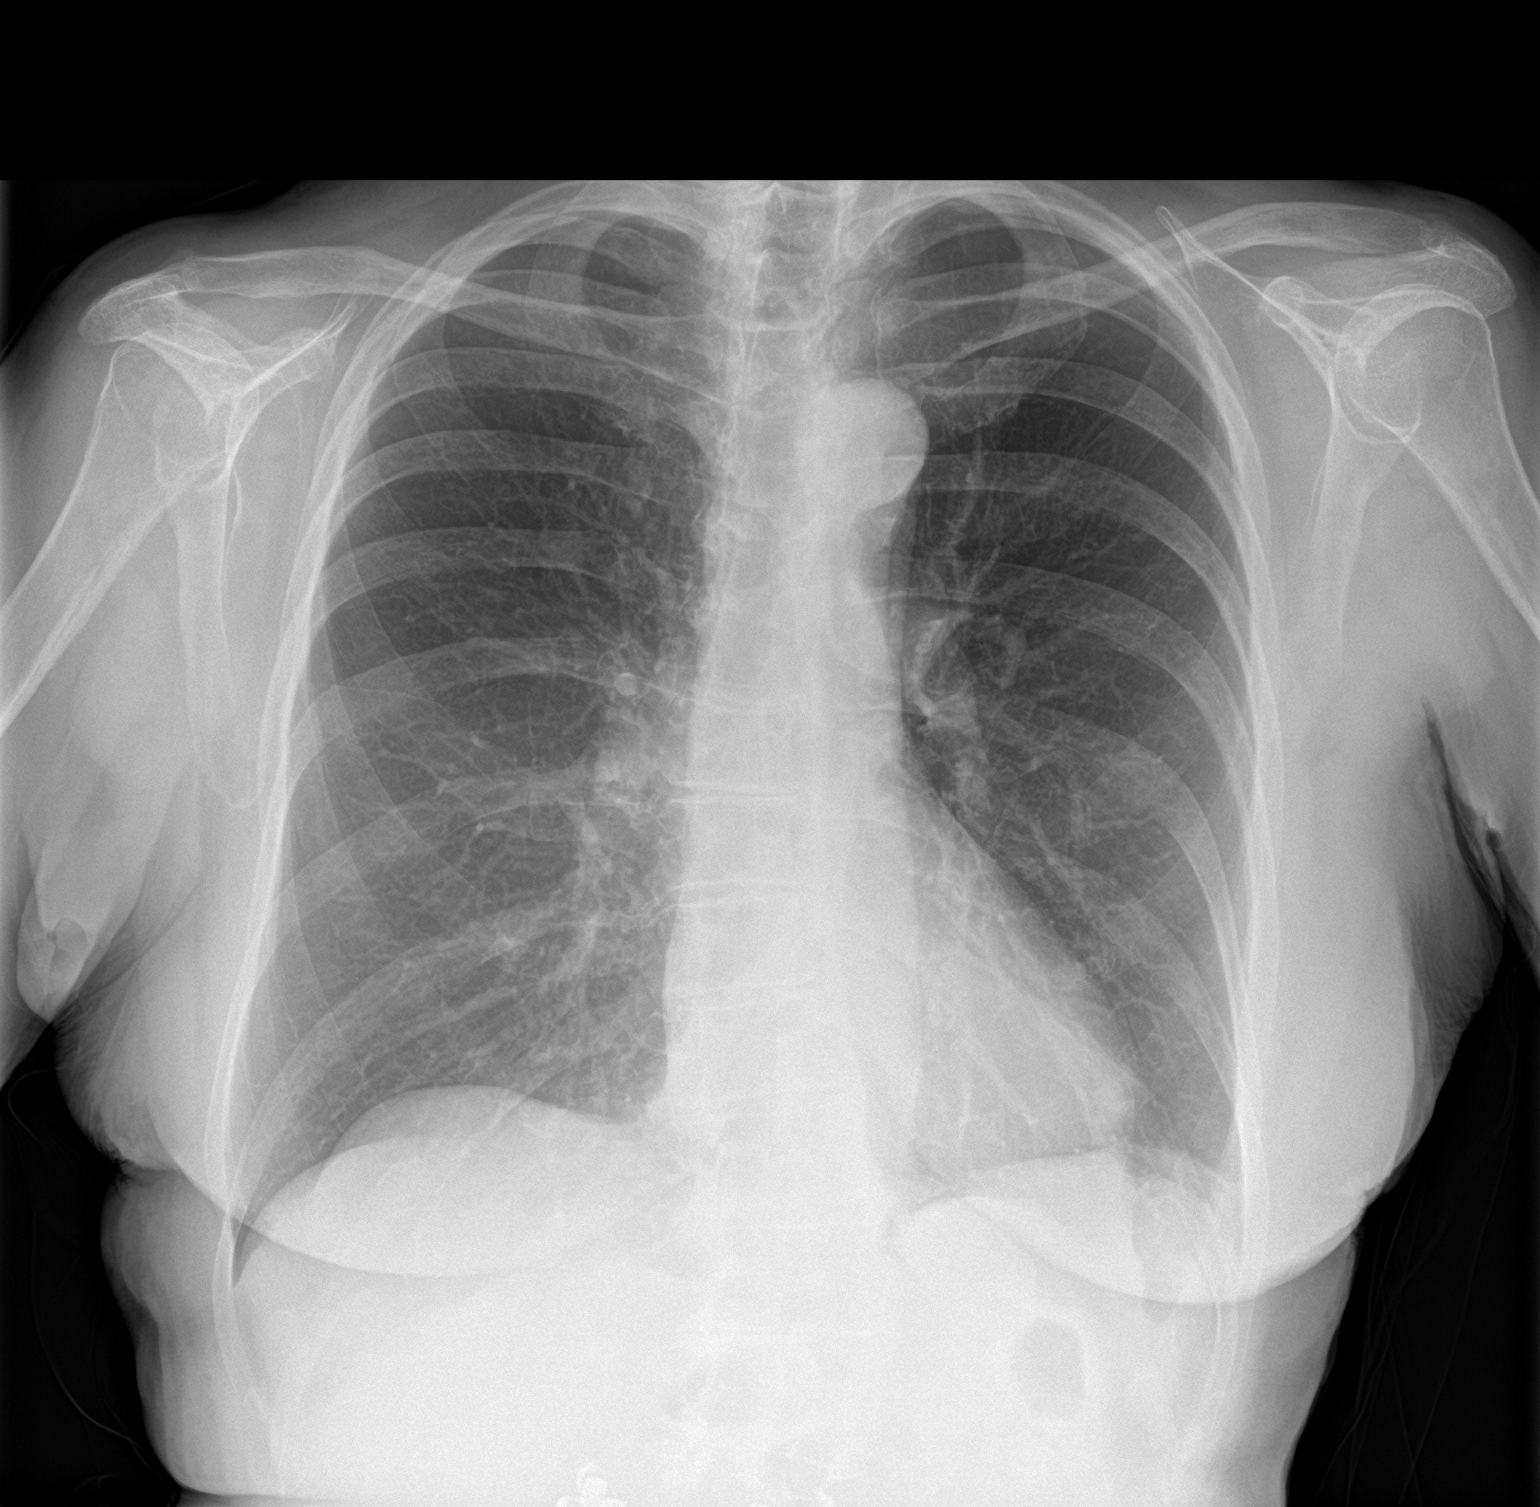

[chest lat]
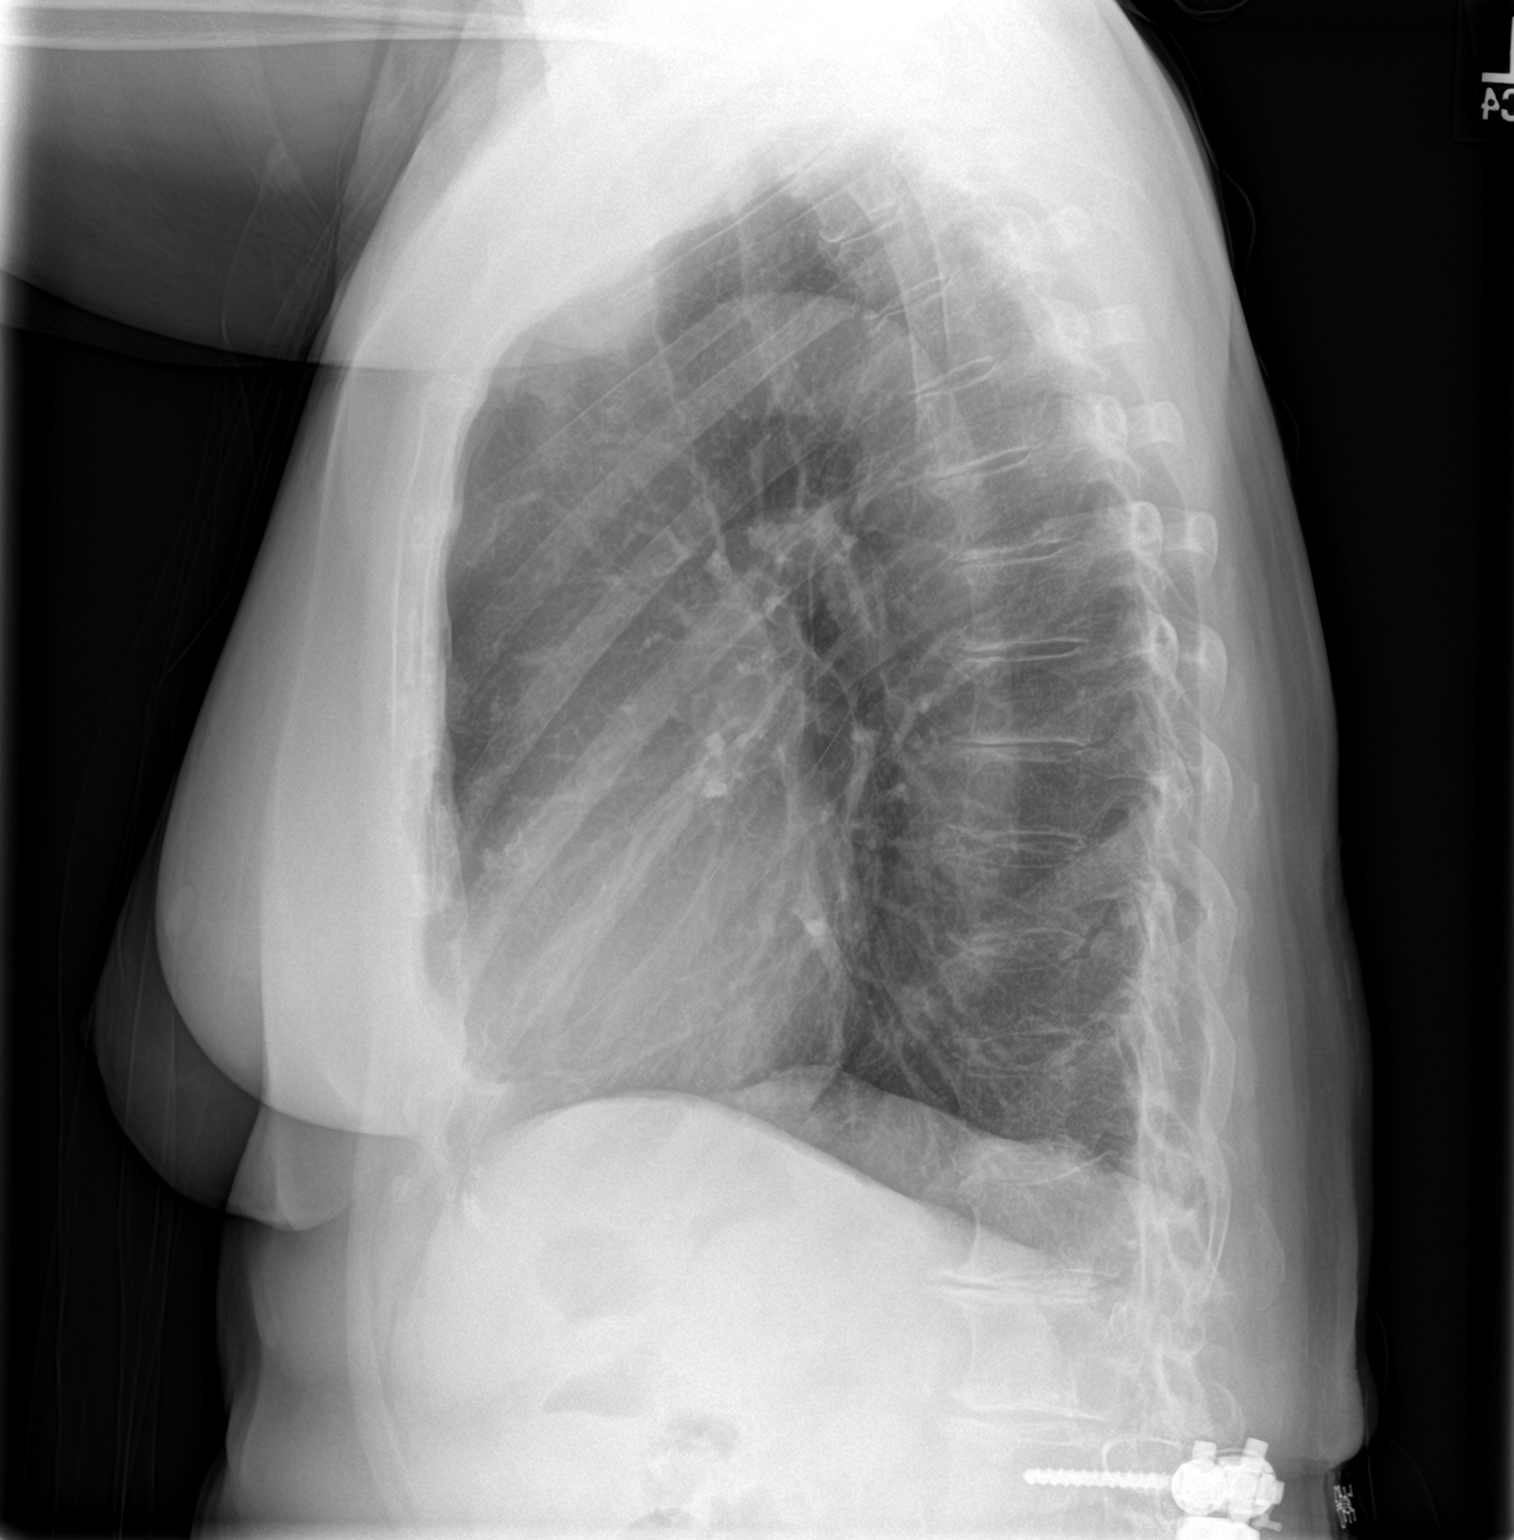

[2 of 2 positions shown; findings below may reference images not displayed]

FINDINGS: There is no evidence of acute infiltrate, pleural effusion or
pneumothorax. The heart size and mediastinal contours are within
normal limits. Radiopaque pedicle screws are seen within the
visualized portion of the mid lumbar spine. The visualized skeletal
structures are otherwise unremarkable.
IMPRESSION: No active cardiopulmonary disease.

## 2021-04-29 IMAGING — RF DG HIP (WITH PELVIS) OPERATIVE*L*
1 series · 4 of 4 positions shown · non-contrast
Comparison: Left hip series 04/20/2019

FLUOROSCOPY TIME:  0 minutes 17 seconds

CLINICAL DATA: 83-year-old female status post left hip replacement.

EXAM:
OPERATIVE LEFT HIP (WITH PELVIS IF PERFORMED) 4 VIEWS
TECHNIQUE: Fluoroscopic spot image(s) were submitted for interpretation
post-operatively.

[Series 1: unknown protocol · 0.20mm/px · 4 of 4 slices shown]
[im 1/4]
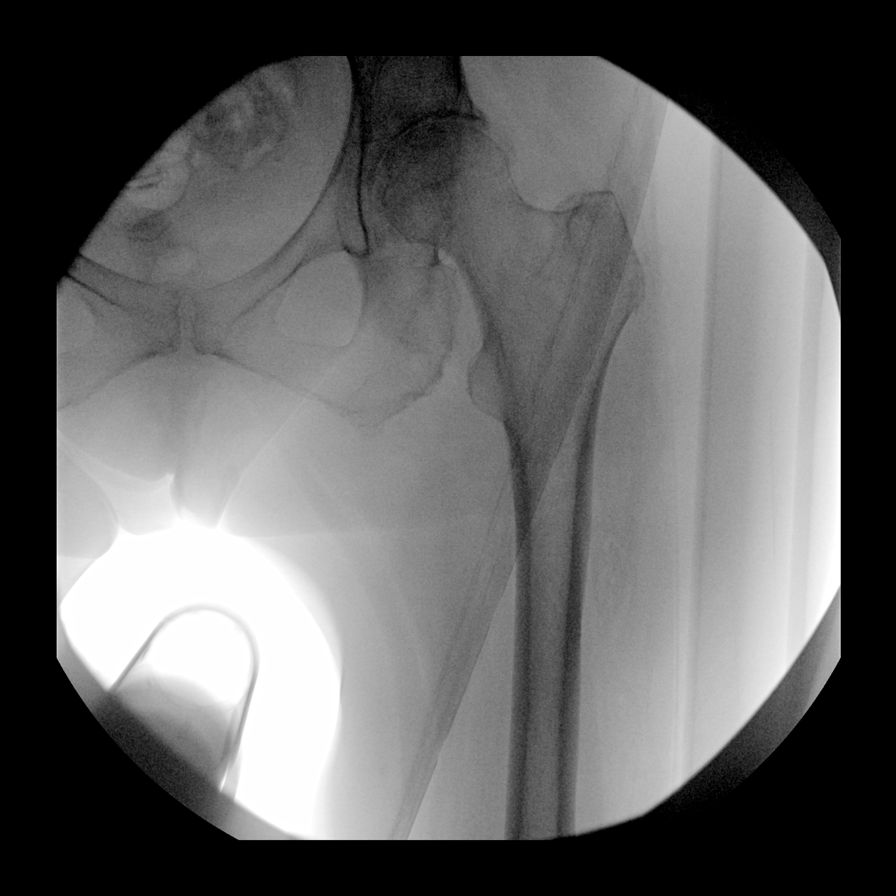
[im 2/4]
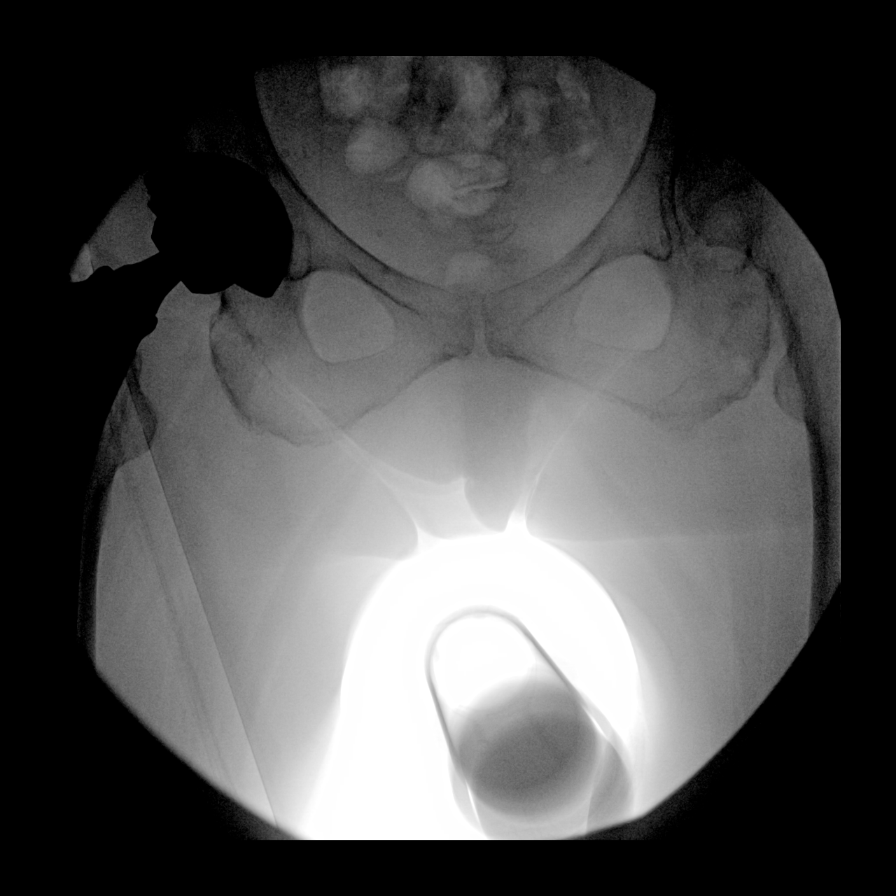
[im 3/4]
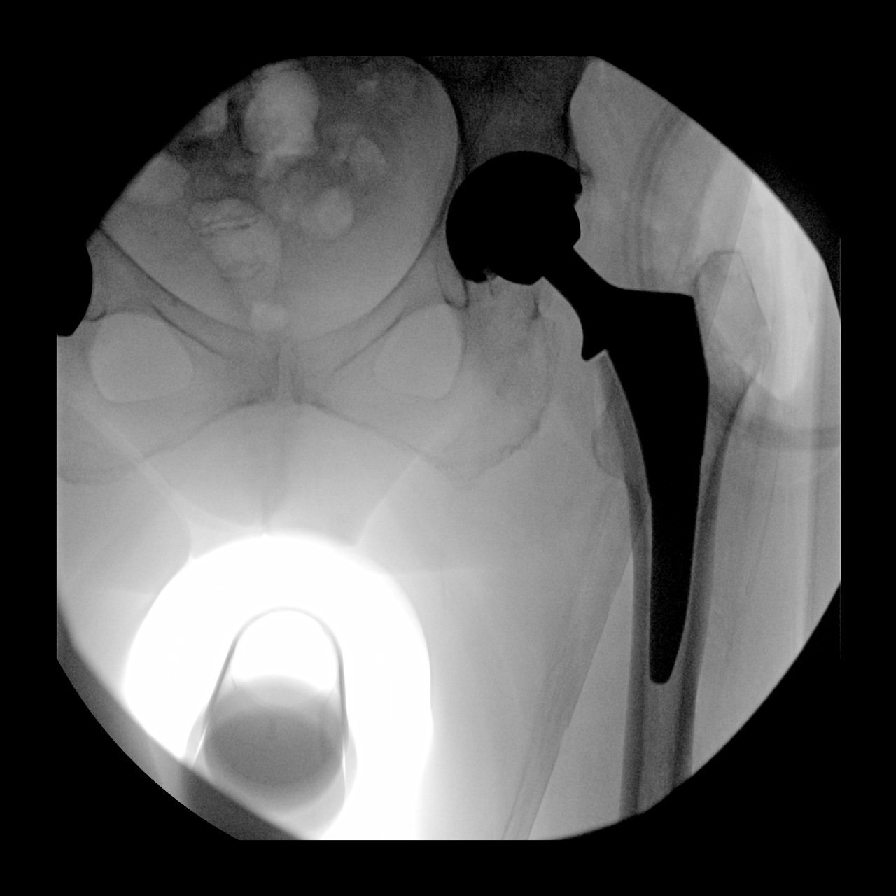
[im 4/4]
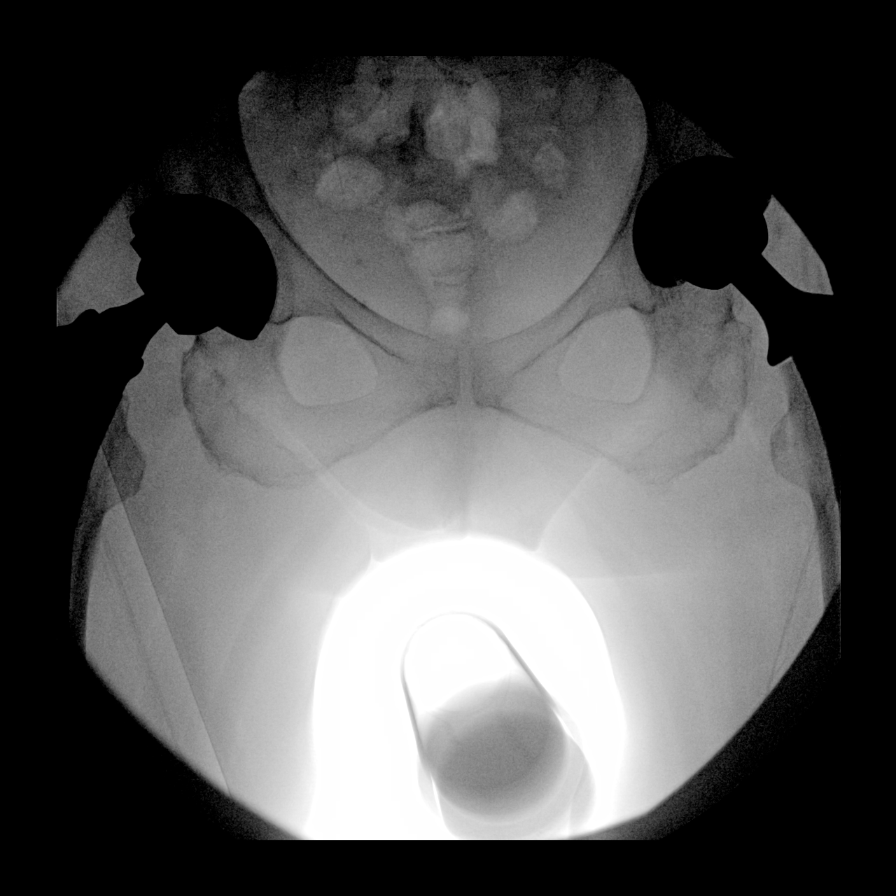

[4 of 4 positions shown; findings below may reference images not displayed]

FINDINGS: Four intraoperative fluoroscopic AP spot views of the left hip and
lower pelvis demonstrate placement of left bipolar hip arthroplasty.
Normal hardware EP alignment. Contralateral right hip arthroplasty
also present. No unexpected osseous changes identified.
IMPRESSION: Intraoperative images of left hip arthroplasty with no adverse
features.

## 2023-12-08 DEATH — deceased
